# Patient Record
Sex: Male | Born: 1940
Health system: Southern US, Community
[De-identification: ages and names within clinical notes are randomized; demographics above are authoritative.]

## PROBLEM LIST (undated history)

## (undated) DIAGNOSIS — I219 Acute myocardial infarction, unspecified: Secondary | ICD-10-CM

## (undated) DIAGNOSIS — E785 Hyperlipidemia, unspecified: Secondary | ICD-10-CM

## (undated) DIAGNOSIS — G250 Essential tremor: Secondary | ICD-10-CM

## (undated) DIAGNOSIS — F419 Anxiety disorder, unspecified: Secondary | ICD-10-CM

## (undated) DIAGNOSIS — J309 Allergic rhinitis, unspecified: Secondary | ICD-10-CM

## (undated) DIAGNOSIS — K219 Gastro-esophageal reflux disease without esophagitis: Secondary | ICD-10-CM

## (undated) DIAGNOSIS — I2510 Atherosclerotic heart disease of native coronary artery without angina pectoris: Secondary | ICD-10-CM

## (undated) DIAGNOSIS — M2550 Pain in unspecified joint: Secondary | ICD-10-CM

## (undated) DIAGNOSIS — M19049 Primary osteoarthritis, unspecified hand: Secondary | ICD-10-CM

## (undated) DIAGNOSIS — E119 Type 2 diabetes mellitus without complications: Secondary | ICD-10-CM

## (undated) DIAGNOSIS — L570 Actinic keratosis: Secondary | ICD-10-CM

## (undated) HISTORY — DX: Gastro-esophageal reflux disease without esophagitis: K21.9

## (undated) HISTORY — DX: Hyperlipidemia, unspecified: E78.5

## (undated) HISTORY — DX: Primary osteoarthritis, unspecified hand: M19.049

## (undated) HISTORY — DX: Atherosclerotic heart disease of native coronary artery without angina pectoris: I25.10

## (undated) HISTORY — DX: Actinic keratosis: L57.0

## (undated) HISTORY — DX: Allergic rhinitis, unspecified: J30.9

## (undated) HISTORY — DX: Acute myocardial infarction, unspecified: I21.9

## (undated) HISTORY — DX: Pain in unspecified joint: M25.50

## (undated) HISTORY — DX: Essential tremor: G25.0

## (undated) HISTORY — DX: Type 2 diabetes mellitus without complications: E11.9

## (undated) HISTORY — DX: Anxiety disorder, unspecified: F41.9

---

## 1998-07-07 ENCOUNTER — Ambulatory Visit (HOSPITAL_COMMUNITY): Admission: RE | Admit: 1998-07-07 | Discharge: 1998-07-07 | Payer: Self-pay | Admitting: Unknown Physician Specialty

## 2005-06-20 ENCOUNTER — Inpatient Hospital Stay (HOSPITAL_COMMUNITY): Admission: EM | Admit: 2005-06-20 | Discharge: 2005-06-22 | Payer: Self-pay | Admitting: Emergency Medicine

## 2005-06-21 HISTORY — PX: CORONARY ANGIOPLASTY: SHX604

## 2005-10-29 ENCOUNTER — Inpatient Hospital Stay (HOSPITAL_COMMUNITY): Admission: EM | Admit: 2005-10-29 | Discharge: 2005-10-30 | Payer: Self-pay | Admitting: Emergency Medicine

## 2005-11-18 ENCOUNTER — Encounter (HOSPITAL_COMMUNITY): Admission: RE | Admit: 2005-11-18 | Discharge: 2006-02-16 | Payer: Self-pay | Admitting: Interventional Cardiology

## 2006-11-02 ENCOUNTER — Ambulatory Visit: Payer: Self-pay | Admitting: *Deleted

## 2007-02-08 ENCOUNTER — Ambulatory Visit: Payer: Self-pay | Admitting: Cardiology

## 2007-06-09 ENCOUNTER — Ambulatory Visit: Payer: Self-pay | Admitting: Cardiology

## 2007-10-11 ENCOUNTER — Ambulatory Visit: Payer: Self-pay

## 2007-10-11 ENCOUNTER — Ambulatory Visit: Payer: Self-pay | Admitting: Cardiology

## 2008-02-08 ENCOUNTER — Ambulatory Visit: Payer: Self-pay | Admitting: Cardiology

## 2008-06-06 ENCOUNTER — Ambulatory Visit: Payer: Self-pay | Admitting: Cardiology

## 2008-10-11 DIAGNOSIS — K219 Gastro-esophageal reflux disease without esophagitis: Secondary | ICD-10-CM

## 2008-10-11 DIAGNOSIS — I2510 Atherosclerotic heart disease of native coronary artery without angina pectoris: Secondary | ICD-10-CM

## 2008-10-11 DIAGNOSIS — R079 Chest pain, unspecified: Secondary | ICD-10-CM | POA: Insufficient documentation

## 2008-10-15 ENCOUNTER — Encounter: Payer: Self-pay | Admitting: Physician Assistant

## 2008-10-16 ENCOUNTER — Ambulatory Visit: Payer: Self-pay | Admitting: Cardiology

## 2008-10-16 ENCOUNTER — Ambulatory Visit: Payer: Self-pay | Admitting: Internal Medicine

## 2008-12-10 ENCOUNTER — Ambulatory Visit: Payer: Self-pay | Admitting: Cardiology

## 2009-02-18 ENCOUNTER — Ambulatory Visit: Payer: Self-pay | Admitting: Cardiology

## 2009-05-23 ENCOUNTER — Encounter (INDEPENDENT_AMBULATORY_CARE_PROVIDER_SITE_OTHER): Payer: Self-pay | Admitting: *Deleted

## 2009-06-26 ENCOUNTER — Ambulatory Visit: Payer: Self-pay | Admitting: Cardiology

## 2009-10-31 ENCOUNTER — Ambulatory Visit: Payer: Self-pay | Admitting: Cardiology

## 2010-02-20 ENCOUNTER — Ambulatory Visit: Payer: Self-pay | Admitting: Cardiology

## 2010-06-03 ENCOUNTER — Emergency Department (HOSPITAL_COMMUNITY)
Admission: EM | Admit: 2010-06-03 | Discharge: 2010-06-03 | Payer: Self-pay | Source: Home / Self Care | Admitting: Family Medicine

## 2010-06-26 ENCOUNTER — Ambulatory Visit
Admission: RE | Admit: 2010-06-26 | Discharge: 2010-06-26 | Payer: Self-pay | Source: Home / Self Care | Attending: Cardiology | Admitting: Cardiology

## 2010-08-31 LAB — GC/CHLAMYDIA PROBE AMP, GENITAL: Chlamydia, DNA Probe: NEGATIVE

## 2010-10-22 ENCOUNTER — Encounter (INDEPENDENT_AMBULATORY_CARE_PROVIDER_SITE_OTHER): Payer: Self-pay

## 2010-10-22 DIAGNOSIS — R0989 Other specified symptoms and signs involving the circulatory and respiratory systems: Secondary | ICD-10-CM

## 2010-11-06 NOTE — H&P (Signed)
NAMECASS, VANDERMEULEN NO.:  [id number]   MEDICAL RECORD NO.:  [id number]          PATIENT TYPE:  INP   LOCATION:  3714                         FACILITY:  [facility]   PHYSICIAN:  Jon Quarry, MD      DATE OF BIRTH:  October 13, 1940   DATE OF ADMISSION:  06/20/2005  DATE OF DISCHARGE:                                HISTORY & PHYSICAL   [facility] is a 70 year old man who is generally cared for by Dr. Duane [facility]. [facility] indicates that he takes Inderal LA 60 milligrams daily.  Recently, this dosage has been increased 80 milligrams daily. He says he  takes this medication for two reasons; one is because of a tremor of his  right hand and the other is because of mild hypertension. [facility] recalls  that several weeks ago he had an episode of dizziness followed by substernal  chest aching. He tried to relax and take his mind off the problem and it  eventually resolved spontaneously. Then today he was walking in [facility] with  his wife when he began to experience substernal chest aching. The pain  radiated up to his neck and into his jaw. His wife said he looked very pale  and was somewhat diaphoretic. She transported him to the [facility] where he was given one sublingual nitroglycerin. He said the put it  under his tongue and it started to dissolve and the doctor told him to take  it out for some reason. Therefore, he was not sure whether the nitroglycerin  had any impact on the pain or not. He also received two aspirin. EMS was  summoned and he was transported to the [facility]. In the  [facility], an EKG was obtained which showed normal sinus rhythm with no  ischemic changes. His blood pressure is 123/83, pulse was 84, O2 saturation  was 99%.   Laboratory studies included a hemoglobin of 15.3, glucose of 121, potassium  4.2. Initial cardiac enzymes were negative. We were asked to see the patient  to arrange admission. [facility]  indicates that recently he has been having  problems with acid reflux which has awakened him at night on two occasions.  He also will experience heartburn after eating. He cannot recall any  episodes of exertional chest pain. He says he is not having any dyspnea on  exertion. His family history is significant in that his father died at the  age of 53 of myocardial infarction. His father also had CABG x3 at age 61.  The patient was told on one occasion his cholesterol level was elevated.  More recently when his cholesterol level was checked it was found to be  normal.   PAST MEDICAL HISTORY:  He medications consist of Ativan 0.5 milligrams 1-2  t.i.d., Inderal LA 80 milligrams daily. He is allergic to CODEINE. He says  that he had a cyst excised from his ear. Otherwise he has had no operations.  Medical illnesses are otherwise none except as described above. Family  history is as described above. His mother has a  history of colon cancer. He  has one brother who is in good health.   SOCIAL HISTORY:  He drinks about one beer per month. He does not smoke. He  does not use drugs. He lives with his wife and as previously mentioned is  usually quite active and is in good health.   REVIEW OF SYSTEMS:  HEAD:  He will sometimes have a dull frontal headache.  He does not have a headache currently. EYES:  Denies diplopia or visual  blurring. EAR, NOSE AND THROAT:  Denies earache, sinus pain or sore throat.  CHEST:  Denies coughing wheezing or chest congestion. CARDIOVASCULAR:  See  above. GI:  See above. There has been no hematemesis or melena. GU:  Denies  dysuria or urinary frequency. NEURO:  There is no history of seizure or  stroke. ENDOCRINE:  Denies excessive thirst, urinary frequency or nocturia.   PHYSICAL EXAM:  [facility] is a pleasant cooperative man who is currently  not experiencing any chest pain. Vital signs are as described above. HEENT  exam is within normal limits. Carotid 2+  without bruits. Chest is clear.  Examination of the back reveals no CVA or point tenderness. Cardiovascular  reveals normal S1-S2. There are no rubs, murmurs or gallops. The abdomen is  benign. There are normal bowel sounds without masses or tenderness.  Neurologic testing and examination of the extremities is normal.   IMPRESSION:  1.  Recurrent chest pain, atypical cardiac versus gastrointestinal cause.  2.  Chronic tremor.  3.  History of hypertension.  4.  Chronic anxiety.  5.  Recent history suggestive of gastroesophageal reflux.   PLAN:  We will admit the patient for telemetry. Continue aspirin, Lovenox  and beta blocker. We will obtain a cardiology consult for stress testing and  treat the patient empirically with Protonix because of his history of acid  reflux.           ______________________________  Jon Quarry, MD     SY/MEDQ  D:  06/20/2005  T:  06/20/2005  Job:  [id number]   cc:   C. [facility], M.D.  Fax: [phone]

## 2010-11-06 NOTE — Consult Note (Signed)
NAMEEMERIL, STILLE NO.:  [id number]   MEDICAL RECORD NO.:  [id number]          PATIENT TYPE:  INP   LOCATION:  6533                         FACILITY:  [facility]   PHYSICIAN:  Lyn Records, M.D.   DATE OF BIRTH:  03/28/1941   DATE OF CONSULTATION:  10/29/2005  DATE OF DISCHARGE:                                   CONSULTATION   REFERRING PHYSICIAN:  Dr. Corky Crafts.   CONCLUSIONS:  1.  Acute coronary syndrome.  2.  Gastroesophageal reflux.  3.  Emotional stress/anxiety disorder.   RECOMMENDATIONS:  1.  IV Integrilin.  2.  IV nitroglycerin.  3.  Lovenox, already received.  4.  Coronary angiography.  5.  Beta blocker therapy.   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  He was admitted to the hospital in early January of  2007 and had a trace-positive troponin.  Cardiolite study did not  demonstrate evidence of ischemia.  He subsequently underwent a GI evaluation  in [place] and was found to have probable gastritis and was placed on  80 mg of Protonix daily with relief of symptoms.  Only within the past week  or 2 since Protonix has been decreased to 40 mg per day has he had a  recurrence of this burning discomfort.  The discomfort seems to be  precipitated by emotional stress.  It is not exertion-related.  The episodes  occur spontaneously.  Last evening after a large steak meal, he developed  substernal burning, became diaphoretic and according to his wife, pale-  appearing.  The discomfort was then associated with some nausea and  vomiting.  Because of persisting severe pain, he was brought to the  emergency room.  The initial point-of-care markers were normal; the  subsequent markers have been elevated (please see below).  He continues  to  currently have some mild sensation of fullness or burning in his chest,  although this is much improved compared to prior.   SIGNIFICANT MEDICAL PROBLEMS:  1.  Gastroesophageal reflux.  2.  Anxiety disorder.  3.  History of hypertension.  4.  History of a tremor.   ALLERGIES:  CODEINE.   TYPICAL MEDICATIONS:  1.  Protonix 40 mg twice a day.  2.  Lorazepam 0.5 mg one to two tablets 3-4 times per day.  3.  Clidinium one to two capsules 4 times a day as needed for GI pain.  4.  Clarinex 5 mg p.o. daily.  5.  Has previously been on Inderal LA 80 mg per day, but it is unclear if he      continues to take this.   HABITS:  Does not smoke.  May have a beer once per month.   FAMILY HISTORY:  His father died of a myocardial infarction at age 26.  His  father had a history of coronary artery bypass surgery at age 85.  His  mother has colon cancer.  He has a brother who has no significant vascular  history.   PHYSICAL EXAMINATION:  GENERAL:  The patient appears uncomfortable and  states he has not been able to sleep all night in the emergency room.  VITAL SIGNS:  His blood pressure is 118/70.  Heart rate is 88.  NECK:  Neck veins are not distended.  Carotid upstroke is 2+.  LUNGS:  Clear.  CARDIAC:  No rub, click, no gallop, no murmur.  ABDOMEN:  Soft.  Bowel sounds normal.  EXTREMITIES:  No edema.  NEUROLOGIC:  Exam unremarkable.   LABORATORY AND ACCESSORY CLINICAL DATA:  EKG is abnormal, demonstrating  inferior T wave abnormality and also mild ST elevation in V2; this is new  compared to tracings from January.   I have not seen chest x-ray yet.   The patient's troponin I done by regular laboratory testing is 2.21.  CK/MB  are 253/20.  BUN and creatinine are normal at 16 and 1.1.   DISCUSSION:  The patient has an acute coronary syndrome.  His story has been  difficult to decipher because he also has esophageal reflux.  He needs  coronary angiography to define coronary anatomy  and potentially treat high-  grade obstruction in his circumflex or right coronary.      Lyn Records, M.D.  Electronically Signed     HWS/MEDQ  D:  10/29/2005  T:  10/30/2005  Job:  [id number]   cc:   C. Duane Lope, M.D.  Fax: [phone]   Corky Crafts, MD  Fax: [phone]

## 2010-11-06 NOTE — H&P (Signed)
[name], Sanchez NO.:  [id number]   MEDICAL RECORD NO.:  [id number]          PATIENT TYPE:  INP   LOCATION:  1826                         FACILITY:  [facility]   PHYSICIAN:  Jackie Plum, M.D.DATE OF BIRTH:  10-04-1940   DATE OF ADMISSION:  10/28/2005  DATE OF DISCHARGE:                                HISTORY & PHYSICAL   CHIEF COMPLAINT:  Chest pain for three days.   HISTORY OF PRESENT ILLNESS:  The patient is a very pleasant 70 year old  gentleman with the only known CAD risk factors of male sex and age, more  than 69 years of age, who presents for the second time since January of this  year with chest pain. Chest pain is described as being midsternal pain. Has  been going on and off intermittently, lasting up to two hours, usually after  meals. Pain is described more like a heaviness with a dyspeptic feature as  previously. Does not have any associated PND, orthopnea, diaphoresis, nausea  or vomiting, palpitations. No history of melena, hematochezia, cough, sputum  production, shortness of breath, fever or chills. In the emergency room, the  patient was seen by Dr. Linwood Dibbles of ED medicine, though he thought that  patient's symptoms were probably GI related.   The patient had had similar chest pains in January of 2007, whereupon he was  seen by [facility] in consultation, and Cardiolite stress test was  done which came back to be negative. At that time, he was referred to see  his primary gastroenterologist in [facility] who felt that his pain was  GI related and started on him on two times daily dose of Protonix and  clidinium hydrochlorothiazide. His symptoms since then according to the  patient have been fairly well controlled, except for recently when his dose  of Protonix was decreased to once daily. In the emergency room, the patient  was seen and evaluated by Dr. Linwood Dibbles of emergency medicine. He felt that  patient's chest pain was very  atypical and actually consistent with  dyspepsia. However, he was concerned about a 12-lead EKG which he obtained  and compared to patient's EKG in January of 2007, when he came to the  hospital with chest pain. The comparison indicated new T-wave inversion in  lead III and aVF and was therefore concerned about possible inferior  ischemia. He therefore called hospitalist service for further evaluation and  management.   PAST MEDICAL HISTORY:  The patient denies any previous history of diabetes,  hypertension, dyslipidemia, heart disease, strokes. He has history of  gastroesophageal reflux disease/dyspepsia for which he takes his Protonix  and clidinium per GI medicine doctor in [place].   FAMILY HISTORY:  His father had a heart attack in his early 81s and had a  CABG in his late 25s.   SOCIAL HISTORY:  The patient is married. He does not smoke cigarettes.  Denies any illicit drug use or alcohol abuse.   REVIEW OF SYSTEMS:  As noted above.   PHYSICAL EXAMINATION:  VITAL SIGNS:  BP 103/67, pulse 82, respirations 18,  temperature 97.5 degrees  Fahrenheit. O2 saturation 98%.  GENERAL:  The patient was comfortable. His pain was nearly completely  resolved at time of my evaluation and indeed actually had wanted to go home.  NECK:  Supple. No JVD.  HEENT:  Oropharynx moist. No pallor. No icterus.  LUNGS:  Clear to auscultation.  CARDIAC:  Regular. No gallops.  ABDOMEN:  Soft. No epigastric tenderness appreciated.  EXTREMITIES:  No cyanosis. No edema.  CENTRAL NERVOUS SYSTEM:  Nonfocal.   LABORATORY DATA:  EKG as noted above. X-ray of the chest did not reveal any  acute infiltrate. WBC count 13.0, hemoglobin 15.8, hematocrit 45.1, platelet  count 202. Point of care myoglobin 117. CK-MB 1.5, troponin less than 0.5.  Sodium 141, potassium 4.2, chloride 106, CO2 31, glucose 120, BUN 15,  creatinine 1.1, calcium 9.2, total protein 6.2, albumin 4.2, AST 22, ALT 26,  alkaline phosphatase  83, bilirubin 1.2, lipase 22. Repeat enzymes  subsequently indicated myoglobin of 123, CK-MB of 2.1, troponin of 0.08.   IMPRESSION:  Chest pain, atypical. Sounds gastrointestinal in  origin/dyspeptic. However, agree with the ED physician regarding new EKG  change compared to previously. The question is whether patient would need to  be admitted for cardiac catheterization should he rule out versus discharge  home for aggressive GI management. Therefore, plan to contact [facility] for  further evaluation and complete ruling him out. Should patient rule out,  then we would have to address issues mentioned above per cardiology.      Jackie Plum, M.D.  Electronically Signed     GO/MEDQ  D:  10/29/2005  T:  10/29/2005  Job:  [id number]

## 2010-11-06 NOTE — Consult Note (Signed)
NAMEHILARY, [facility]              ACCOUNT NO.:  [id number]   MEDICAL RECORD NO.:  [id number]          PATIENT TYPE:  INP   LOCATION:  3714                         FACILITY:  [facility]   PHYSICIAN:  Corky Crafts, MDDATE OF BIRTH:  1941/04/08   DATE OF CONSULTATION:  DATE OF DISCHARGE:                                   CONSULTATION   REASON FOR CONSULTATION:  Chest pain.   HISTORY OF PRESENT ILLNESS:  The patient is a 70 year old man with a history  of hypertension who has been experiencing chest pressure over the past 2  months.  He states that it feels like he needs to belch.  The sensation is  relieved when he does belch.  The most common factor that elicits this pain  is anxiety when he is able to relax, the pain goes away.  He is often able  to get rid of the pain by getting up and walking around as this takes his  mind off of what is making him anxious.  He was recently diagnosed with  attention deficit hyperactivity disorder and is being treated for that with  medication.  He denies shortness of breath with the chest pain.  He has not  had diaphoresis.  Occasionally, the pain does radiate to his neck.  Since he  has been in the hospital, he has not had any chest pain.   PAST MEDICAL HISTORY:  1.  Tremor.  2.  Hypertension.  3.  Urethral stricture.   PAST SURGICAL HISTORY:  1.  Urethral surgery.  2.  Ear cyst removal.   MEDICATIONS:  1.  Inderal LA 60 mg daily.  2.  Focalin for ADHD.  3.  Ativan t.i.d. for an anxiety disorder.   ALLERGIES:  CODEINE.   SOCIAL HISTORY:  The patient does not smoke.  He occasionally drinks  alcohol.  No IV drug abuse.  No harmful medicines.  He works as a Metallurgist.   FAMILY HISTORY:  No early coronary artery disease.  His father died with an  MI at age 40.   REVIEW OF SYSTEMS:  No fevers, chills, no weight loss, no shortness of  breath.  Chest pain as described above.  No nausea, vomiting, no dysuria.  No focal weakness.   All other systems negative.   PHYSICAL EXAMINATION:  VITAL SIGNS:  Blood pressure 105/72, heart rate is 65  ranging up to 80 beats per minute.  Respiratory rate is 18.  GENERAL APPEARANCE:  In general, the patient is awake and alert and in no  apparent distress.  HEENT:  Head normocephalic, atraumatic.  Eyes:  Extraocular muscles are  intact.  Oropharynx is clear.  NECK:  Supple.  No JVD, no carotid bruits.  CARDIOVASCULAR:  Regular rate and rhythm, S1, S2.  The S2 is physiologically  split.  LUNGS:  Clear to auscultation bilaterally.  ABDOMEN:  Soft, nontender, nondistended.  EXTREMITIES:  Showed no clubbing, cyanosis or edema, 2+ posterior tibial  pulses bilaterally.  NEURO:  No focal deficit.   ELECTROCARDIOGRAM:  Normal sinus rhythm, no ST-T wave changes.   LABORATORY  DATA:  Hematocrit 42, potassium 3.7, creatinine 0.9, blood sugar  is 126.  Cardiac enzymes:  Initial CK 10 being troponin, 42, 1.0 and less  than 0.5 seconds.  CK of 44, MB of 1.4 and troponin of 0.05.  Final set:  51  was the CK, 1.6 MB, 0.07 was troponin.  Cholesterol shows LDL of 83.   ASSESSMENT AND PLAN:  This is a 70 year old with chest pain that has many  atypical features and a borderline troponin level of:  1.  Cardiac.  We will plan exercise Cardiolite in the a.m. to evaluate for      ischemia.  His symptoms are atypical enough, but I do not feel that      catheterization is warranted.  The patient agrees the significance of      the troponin of 0.07 is unclear.  2.  Will hold Inderal for a stress test.  Otherwise, the patient would      benefit from an aspirin 81 mg p.o. daily along with other preventive      care.  He may need fish oil for elevated triglycerides.  3.  I will follow the patient while he is in the hospital.      Corky Crafts, MD  Electronically Signed     JSV/MEDQ  D:  06/21/2005  T:  06/21/2005  Job:  [id number]

## 2010-11-06 NOTE — Cardiovascular Report (Signed)
NAMESERGIO, ZAWISLAK              ACCOUNT NO.:  [id number]   MEDICAL RECORD NO.:  [id number]          PATIENT TYPE:  INP   LOCATION:  6533                         FACILITY:  [facility]   PHYSICIAN:  Corky Crafts, MDDATE OF BIRTH:  [birth year]   DATE OF PROCEDURE:  10/29/2005  DATE OF DISCHARGE:  10/30/2005                              CARDIAC CATHETERIZATION   REFERRING PHYSICIAN:  Jackie Plum, M.D.   PROCEDURES:  1.  Left heart catheterization.  2.  Left ventriculogram.  3.  Coronary angiogram.  4.  Percutaneous coronary intervention of left anterior descending  and      intracoronary vascular ultrasound of left anterior descending.   OPERATOR:  Corky Crafts, M.D.   INDICATIONS:  Non-ST-elevation Myocardial Infarction with recurrent chest  pain.   PROCEDURAL NARRATIVE:  The risks and benefits of cardiac catheterization  were explained to the patient, and informed consent was obtained.  The  patient was brought to the catheterization lab and placed on the table.  He  was prepped and draped in the usual sterile fashion.  His right groin was  infiltrated with 1% lidocaine.  A 6-French arterial sheath was placed into  the right femoral artery using the modified Seldinger technique.  Left  coronary artery angiography was performed using a JL4.0 catheter.  The  catheter was advanced to the ascending aorta and into the vessel ostium  under fluoroscopic guidance.  Digital angiography was performed in multiple  projections using hand injection of contrast.  The right coronary artery  angiography was then  performed using a JR4.0 catheter.  Digital angiography  was performed in multiple projections using hand injection of contrast.  The  percutaneous coronary intervention was then performed.  Please see below for  details.  A pigtail catheter was then advanced to the ascending aorta across  the aortic valve under fluoroscopic guidance.  Continuous pressure  monitoring  was performed.  A 30-degree RAO ventriculogram was performed  using power injection of contrast.  Then, under continuous pressure  monitoring, the catheter was pulled back across the aortic valve into the  ascending aorta.  The 6-French arterial sheath will be pulled using manual  compression.   FINDINGS:  The left main was widely patent.   The circumflex was a large vessel which was widely patent.  There were minor  irregularities.  There was a large first obtuse marginal also with minor  irregularities.   The left anterior descending was a large vessel.  There was a 90% proximal  LAD lesion. In the mid LAD, there was a 50 to 70% lesion.  There were other  minor irregularities throughout the vessel.  There was a small first  diagonal with no apparent disease.  The second and third diagonal were also  small vessels.   The right coronary artery was a large dominant vessel.  There were minor  luminal irregularities in this vessel.   The left ventriculogram showed a small area of anterolateral hypokinesis.  The overall ejection fraction was 55%.   HEMODYNAMICS:  Left ventricular pressure 109/11 with an LV EDP of 19 mmHg.  Aortic pressure 104/64 with a mean aortic pressure of 81 mmHg.   PERCUTANEOUS CORONARY INTERVENTION NARRATIVE:  A COS 3.5 guiding catheter  was used to engage the ostium of the left main coronary artery under  fluoroscopic guidance.  An Clinical cytogeneticist was advanced across the lesion in  the LAD.  A 2.5 x 12 Maverick balloon was advanced to the lesion in the  proximal LAD and inflated to 8 atmospheres for 22 seconds.  Then a 3.0 x 18  mm Cypher stent was advanced to the lesion in the proximal LAD and  positioned under fluoroscopic guidance.  It was deployed at 15 atmospheres  for 43 seconds.  The stent was then post dilated with a 3.5 x 12 Quantum  Maverick balloon, inflated to 10 atmospheres for 18 seconds.  An IVUS was  then performed which showed under deployment  of the LAD stent.  At the  distal edge of the stent, the 60 to 70% mid LAD lesion had a cross sectional  area of 3.7 mm squared.  A 3.0 x 13 mm Cypher stent was then advanced to the  distal edge of the recently placed stent and deployed at 16 atmospheres for  34 seconds.  The 3.5 x 12 Quantum Maverick balloon was then reinserted and  used to post dilated the entire stented area.  The distal area of the stent  was dilated to 16 atmospheres for 32 seconds and then 18 atmospheres for 25  seconds.  The balloon was withdrawn again and inflated to 18 atmospheres for  29 seconds . The proximal edge of the stent was then inflated to 14  atmospheres for 17 seconds.  We then repeated the IVUS which showed good  apposition of all of the stented segments.  Angiographically, there was TIMI-  3 flow with a 0% residual stenosis.   IMPRESSION:  1.  Significant left anterior descending disease with TIMI-2 flow.  This was      the cause of the patient's symptoms and abnormal cardiac enzymes.  2.  Successful overlapping drug-eluting stent placement to the left anterior      descending with a 3.0 x 18 mm Cypher stent and 3.0 x 13 mm Cypher stent.      The stented area was then post dilated to 3.6 mm in diameter proximally.  3.  Excellent angiographic appearance as well as IVUS appearance.   RECOMMENDATIONS:  1.  The patient will continue Integrilin for 18 hours intravenously.  2.  He will be monitored on telemetry  3.  We will continue aspirin 325 mg p.o. daily, Plavix 75 mg p.o. daily      indefinitely.  4.  He will continue atenolol.  5.  We will also initiate a statin.  6.  If his blood pressure will tolerate, we will consider adding an ACE      inhibitor.  7.  I will follow up with the patient in the office.  8.  We will consider discharging the patient tomorrow if there are no      complications.  9.  The sheath will be removed using manual compression.      Corky Crafts, MD   Electronically Signed     JSV/MEDQ  D:  10/29/2005  T:  10/30/2005  Job:  [id number]

## 2010-12-29 ENCOUNTER — Encounter: Payer: Self-pay | Admitting: Physician Assistant

## 2011-09-02 DIAGNOSIS — N400 Enlarged prostate without lower urinary tract symptoms: Secondary | ICD-10-CM | POA: Diagnosis not present

## 2011-09-28 DIAGNOSIS — K146 Glossodynia: Secondary | ICD-10-CM | POA: Diagnosis not present

## 2011-09-28 DIAGNOSIS — B009 Herpesviral infection, unspecified: Secondary | ICD-10-CM | POA: Diagnosis not present

## 2011-10-04 DIAGNOSIS — Z0000 Encounter for general adult medical examination without abnormal findings: Secondary | ICD-10-CM | POA: Diagnosis not present

## 2011-10-04 DIAGNOSIS — E119 Type 2 diabetes mellitus without complications: Secondary | ICD-10-CM | POA: Diagnosis not present

## 2011-10-04 DIAGNOSIS — I2510 Atherosclerotic heart disease of native coronary artery without angina pectoris: Secondary | ICD-10-CM | POA: Diagnosis not present

## 2011-11-04 ENCOUNTER — Encounter (INDEPENDENT_AMBULATORY_CARE_PROVIDER_SITE_OTHER): Payer: Self-pay

## 2011-11-04 DIAGNOSIS — R0989 Other specified symptoms and signs involving the circulatory and respiratory systems: Secondary | ICD-10-CM

## 2011-11-22 DIAGNOSIS — E1139 Type 2 diabetes mellitus with other diabetic ophthalmic complication: Secondary | ICD-10-CM | POA: Diagnosis not present

## 2011-12-06 DIAGNOSIS — L570 Actinic keratosis: Secondary | ICD-10-CM | POA: Diagnosis not present

## 2012-03-01 DIAGNOSIS — N35919 Unspecified urethral stricture, male, unspecified site: Secondary | ICD-10-CM | POA: Diagnosis not present

## 2012-04-04 DIAGNOSIS — F411 Generalized anxiety disorder: Secondary | ICD-10-CM | POA: Diagnosis not present

## 2012-04-04 DIAGNOSIS — E7800 Pure hypercholesterolemia, unspecified: Secondary | ICD-10-CM | POA: Diagnosis not present

## 2012-04-04 DIAGNOSIS — J309 Allergic rhinitis, unspecified: Secondary | ICD-10-CM | POA: Diagnosis not present

## 2012-04-04 DIAGNOSIS — Z23 Encounter for immunization: Secondary | ICD-10-CM | POA: Diagnosis not present

## 2012-04-04 DIAGNOSIS — Z8619 Personal history of other infectious and parasitic diseases: Secondary | ICD-10-CM | POA: Diagnosis not present

## 2012-04-04 DIAGNOSIS — L570 Actinic keratosis: Secondary | ICD-10-CM | POA: Diagnosis not present

## 2012-04-04 DIAGNOSIS — B009 Herpesviral infection, unspecified: Secondary | ICD-10-CM | POA: Diagnosis not present

## 2012-04-04 DIAGNOSIS — K146 Glossodynia: Secondary | ICD-10-CM | POA: Diagnosis not present

## 2012-04-04 DIAGNOSIS — G250 Essential tremor: Secondary | ICD-10-CM | POA: Diagnosis not present

## 2012-04-04 DIAGNOSIS — E119 Type 2 diabetes mellitus without complications: Secondary | ICD-10-CM | POA: Diagnosis not present

## 2012-04-04 DIAGNOSIS — R21 Rash and other nonspecific skin eruption: Secondary | ICD-10-CM | POA: Diagnosis not present

## 2012-04-04 DIAGNOSIS — L280 Lichen simplex chronicus: Secondary | ICD-10-CM | POA: Diagnosis not present

## 2012-04-04 DIAGNOSIS — G252 Other specified forms of tremor: Secondary | ICD-10-CM | POA: Diagnosis not present

## 2012-04-11 DIAGNOSIS — I2510 Atherosclerotic heart disease of native coronary artery without angina pectoris: Secondary | ICD-10-CM | POA: Diagnosis not present

## 2012-04-11 DIAGNOSIS — I252 Old myocardial infarction: Secondary | ICD-10-CM | POA: Diagnosis not present

## 2012-04-11 DIAGNOSIS — I10 Essential (primary) hypertension: Secondary | ICD-10-CM | POA: Diagnosis not present

## 2012-04-11 DIAGNOSIS — E782 Mixed hyperlipidemia: Secondary | ICD-10-CM | POA: Diagnosis not present

## 2012-05-15 DIAGNOSIS — L578 Other skin changes due to chronic exposure to nonionizing radiation: Secondary | ICD-10-CM | POA: Diagnosis not present

## 2012-06-26 DIAGNOSIS — D485 Neoplasm of uncertain behavior of skin: Secondary | ICD-10-CM | POA: Diagnosis not present

## 2012-06-26 DIAGNOSIS — L578 Other skin changes due to chronic exposure to nonionizing radiation: Secondary | ICD-10-CM | POA: Diagnosis not present

## 2012-07-13 DIAGNOSIS — J329 Chronic sinusitis, unspecified: Secondary | ICD-10-CM | POA: Diagnosis not present

## 2012-08-30 DIAGNOSIS — R6882 Decreased libido: Secondary | ICD-10-CM | POA: Diagnosis not present

## 2012-08-30 DIAGNOSIS — N135 Crossing vessel and stricture of ureter without hydronephrosis: Secondary | ICD-10-CM | POA: Diagnosis not present

## 2012-10-10 DIAGNOSIS — R209 Unspecified disturbances of skin sensation: Secondary | ICD-10-CM | POA: Diagnosis not present

## 2012-10-10 DIAGNOSIS — E119 Type 2 diabetes mellitus without complications: Secondary | ICD-10-CM | POA: Diagnosis not present

## 2012-10-10 DIAGNOSIS — Z125 Encounter for screening for malignant neoplasm of prostate: Secondary | ICD-10-CM | POA: Diagnosis not present

## 2012-10-10 DIAGNOSIS — I10 Essential (primary) hypertension: Secondary | ICD-10-CM | POA: Diagnosis not present

## 2012-10-10 DIAGNOSIS — E782 Mixed hyperlipidemia: Secondary | ICD-10-CM | POA: Diagnosis not present

## 2012-10-10 DIAGNOSIS — Z0000 Encounter for general adult medical examination without abnormal findings: Secondary | ICD-10-CM | POA: Diagnosis not present

## 2012-10-10 DIAGNOSIS — F411 Generalized anxiety disorder: Secondary | ICD-10-CM | POA: Diagnosis not present

## 2012-10-21 DIAGNOSIS — J019 Acute sinusitis, unspecified: Secondary | ICD-10-CM | POA: Diagnosis not present

## 2012-10-24 DIAGNOSIS — R209 Unspecified disturbances of skin sensation: Secondary | ICD-10-CM | POA: Diagnosis not present

## 2012-10-24 DIAGNOSIS — F411 Generalized anxiety disorder: Secondary | ICD-10-CM | POA: Diagnosis not present

## 2012-10-24 DIAGNOSIS — E782 Mixed hyperlipidemia: Secondary | ICD-10-CM | POA: Diagnosis not present

## 2012-10-24 DIAGNOSIS — E119 Type 2 diabetes mellitus without complications: Secondary | ICD-10-CM | POA: Diagnosis not present

## 2012-10-24 DIAGNOSIS — Z0000 Encounter for general adult medical examination without abnormal findings: Secondary | ICD-10-CM | POA: Diagnosis not present

## 2012-10-24 DIAGNOSIS — I10 Essential (primary) hypertension: Secondary | ICD-10-CM | POA: Diagnosis not present

## 2012-10-24 DIAGNOSIS — Z125 Encounter for screening for malignant neoplasm of prostate: Secondary | ICD-10-CM | POA: Diagnosis not present

## 2012-10-31 ENCOUNTER — Encounter (INDEPENDENT_AMBULATORY_CARE_PROVIDER_SITE_OTHER): Payer: Self-pay

## 2012-10-31 DIAGNOSIS — R0989 Other specified symptoms and signs involving the circulatory and respiratory systems: Secondary | ICD-10-CM

## 2012-11-17 DIAGNOSIS — E1139 Type 2 diabetes mellitus with other diabetic ophthalmic complication: Secondary | ICD-10-CM | POA: Diagnosis not present

## 2012-12-05 DIAGNOSIS — Z79899 Other long term (current) drug therapy: Secondary | ICD-10-CM | POA: Diagnosis not present

## 2012-12-05 DIAGNOSIS — E782 Mixed hyperlipidemia: Secondary | ICD-10-CM | POA: Diagnosis not present

## 2013-01-16 DIAGNOSIS — B009 Herpesviral infection, unspecified: Secondary | ICD-10-CM | POA: Diagnosis not present

## 2013-01-16 DIAGNOSIS — L280 Lichen simplex chronicus: Secondary | ICD-10-CM | POA: Diagnosis not present

## 2013-01-16 DIAGNOSIS — Z8619 Personal history of other infectious and parasitic diseases: Secondary | ICD-10-CM | POA: Diagnosis not present

## 2013-01-16 DIAGNOSIS — L298 Other pruritus: Secondary | ICD-10-CM | POA: Diagnosis not present

## 2013-01-16 DIAGNOSIS — L570 Actinic keratosis: Secondary | ICD-10-CM | POA: Diagnosis not present

## 2013-01-16 DIAGNOSIS — K146 Glossodynia: Secondary | ICD-10-CM | POA: Diagnosis not present

## 2013-02-28 DIAGNOSIS — N35919 Unspecified urethral stricture, male, unspecified site: Secondary | ICD-10-CM | POA: Diagnosis not present

## 2013-03-22 DIAGNOSIS — Z23 Encounter for immunization: Secondary | ICD-10-CM | POA: Diagnosis not present

## 2013-03-22 DIAGNOSIS — D239 Other benign neoplasm of skin, unspecified: Secondary | ICD-10-CM | POA: Diagnosis not present

## 2013-03-22 DIAGNOSIS — T148XXA Other injury of unspecified body region, initial encounter: Secondary | ICD-10-CM | POA: Diagnosis not present

## 2013-04-11 DIAGNOSIS — E119 Type 2 diabetes mellitus without complications: Secondary | ICD-10-CM | POA: Diagnosis not present

## 2013-04-11 DIAGNOSIS — L570 Actinic keratosis: Secondary | ICD-10-CM | POA: Diagnosis not present

## 2013-04-12 ENCOUNTER — Encounter: Payer: Self-pay | Admitting: *Deleted

## 2013-04-12 ENCOUNTER — Encounter: Payer: Self-pay | Admitting: Interventional Cardiology

## 2013-04-13 ENCOUNTER — Encounter: Payer: Self-pay | Admitting: Interventional Cardiology

## 2013-04-13 ENCOUNTER — Ambulatory Visit (INDEPENDENT_AMBULATORY_CARE_PROVIDER_SITE_OTHER): Payer: Medicare Other | Admitting: Interventional Cardiology

## 2013-04-13 VITALS — BP 140/70 | HR 64 | Ht 67.0 in | Wt 163.0 lb

## 2013-04-13 DIAGNOSIS — E782 Mixed hyperlipidemia: Secondary | ICD-10-CM | POA: Insufficient documentation

## 2013-04-13 DIAGNOSIS — I252 Old myocardial infarction: Secondary | ICD-10-CM

## 2013-04-13 DIAGNOSIS — I2510 Atherosclerotic heart disease of native coronary artery without angina pectoris: Secondary | ICD-10-CM | POA: Diagnosis not present

## 2013-04-13 DIAGNOSIS — I10 Essential (primary) hypertension: Secondary | ICD-10-CM

## 2013-04-13 LAB — LIPID PANEL
HDL: 36.4 mg/dL — ABNORMAL LOW (ref >39.00)
Total CHOL/HDL Ratio: 2
VLDL: 21.2 mg/dL (ref 0.0–40.0)

## 2013-04-13 LAB — ALT: ALT: 21 U/L (ref 0–53)

## 2013-04-13 MED ORDER — LOSARTAN POTASSIUM 25 MG PO TABS: 25.0000 mg | ORAL_TABLET | Freq: Every day | ORAL | Status: DC

## 2013-04-13 MED ORDER — CLOPIDOGREL BISULFATE 75 MG PO TABS: 75.0000 mg | ORAL_TABLET | Freq: Every day | ORAL | Status: DC

## 2013-04-13 MED ORDER — NITROGLYCERIN 0.4 MG SL SUBL: 0.4000 mg | SUBLINGUAL_TABLET | SUBLINGUAL | Status: DC | PRN

## 2013-04-13 NOTE — Progress Notes (Signed)
Patient ID: Jon Sanchez, male   DOB: December 28, 1940, 72 y.o.   MRN: [id number]    [address] [place], [place]  [address] Phone: [phone] Fax:  [phone]  Date:  04/13/2013   ID:  Jon Sanchez, DOB [birth year], MRN [id number]  PCP:  Jon primary provider on file.      History of Present Illness: Jon Sanchez is a 71 y.o. male who has had a DES to the LAD. He has been under stress due to caring for his elderly mother. CAD/ASCVD:  He has not been exercising that much. He has resumed walking one hour per day, but has not been regular. Denies : Diaphoresis.  Dizziness.  Dyspnea on exertion.  Fatigue.  Leg edema.  Nitroglycerin.     Wt Readings from Last 3 Encounters:  04/13/13 163 lb (73.936 kg)     Past Medical History  Diagnosis Date  . CAD (coronary artery disease)   . Chest pain   . GERD (gastroesophageal reflux disease)   . Myocardial infarct   . Pain in joint   . Osteoarthrosis, unspecified whether generalized or localized, hand   . Diabetes   . Allergic rhinitis   . Hyperlipidemia   . Anxiety   . Benign essential tremor   . Actinic keratoses     Current Outpatient Prescriptions  Medication Sig Dispense Refill  . aspirin 81 MG tablet Take 81 mg by mouth daily.      . B Complex Vitamins (VITAMIN B COMPLEX PO) Take 1 tablet by mouth daily.      . clopidogrel (PLAVIX) 75 MG tablet Take 75 mg by mouth daily.      Marland Kitchen ezetimibe-simvastatin (VYTORIN) 10-40 MG per tablet Take 1 tablet by mouth at bedtime.      Marland Kitchen LORazepam (ATIVAN) 0.5 MG tablet Take 0.5 mg by mouth every 8 (eight) hours.      Marland Kitchen losartan (COZAAR) 25 MG tablet Take 25 mg by mouth daily.      . nitroGLYCERIN (NITROSTAT) 0.4 MG SL tablet Place 0.4 mg under the tongue every 5 (five) minutes as needed for chest pain.      . Omega-3 Fatty Acids (FISH OIL PO) Take 1 tablet by mouth daily.      . sertraline (ZOLOFT) 100 MG tablet Take 100 mg by mouth daily.       Jon current  facility-administered medications for this visit.    Allergies:    Allergies  Allergen Reactions  . Codeine     Social History:  The patient  reports that he has never smoked. He does not have any smokeless tobacco history on file. He reports that he does not drink alcohol or use illicit drugs.   Family History:  The patient's family history includes Colon cancer in his mother; Coronary artery disease in an other family member; Heart attack in his father; Lung cancer in his father.   ROS:  Please see the history of present illness.  Jon nausea, vomiting.  Jon fevers, chills.  Jon focal weakness.  Jon dysuria. Stress with family situation.  All other systems reviewed and negative.   PHYSICAL EXAM: VS:  BP 140/70  Pulse 64  Ht 5\' 7"  (1.702 m)  Wt 163 lb (73.936 kg)  BMI 25.52 kg/m2 Well nourished, well developed, in Jon acute distress HEENT: normal Neck: Jon JVD, Jon carotid bruits Cardiac:  normal S1, S2; RRR;  Lungs:  clear to auscultation bilaterally, Jon wheezing, rhonchi  or rales Abd: soft, nontender, Jon hepatomegaly Ext: Jon edema Skin: warm and dry Neuro:   Jon focal abnormalities noted  EKG:  Normal     ASSESSMENT AND PLAN:  Coronary atherosclerosis of native coronary artery  Continue Plavix Tablet, 75 MG, 1 tablet, Orally, Once a day, 90 day(s), 90, Refills 3 Continue Nitroglycerin Tablet Sublingual, 0.4 MG, 1 tablet under the tongue, Sublingual, as directed, 25, Refills 6 Diagnostic Imaging:EKG Jon Sanchez,Jon Sanchez 04/11/2012 08:51:24 AM > Jon Sanchez,Jon Sanchez 04/11/2012 09:36:50 AM > Jon Sanchez, Jon Sanchez  Jon angina.First generation DES in critical aea of his heart. WOuld continue plavix.  He has significant bruising, especially after a fall.  WIll stop aspirin. Normal stress test in 2008. Jon symptoms like what he had prior to his stent.    2. Mixed hyperlipidemia Was inresearch study. Cholesterol was regulated by [employer],  Finished.  Hwas started on Vytorin .  He will need  a lipid panel.    3. Old myocardial infarction  Continue Aspirin Tablet Delayed Release, 81 MG, 1 tablet, Orally, Once a day Continue Propranolol HCl CR Capsule Extended Release 24 Hour, 60 MG, TAKE 1 CAPSULE BY MOUTH EVERY DAY Jon CHF.    4. Benign hypertension  Continue Losartan Potassium Tablet, 25 MG, 1 tablet, Orally, Once a day, 90 day(s), 90, Refills 3 Cough with lisinopril. Diastolics much better on meds.  Controlled at home.    Preventive Medicine  Adult topics discussed:  Diet: healthy diet, low calorie, low fat.  Exercise: at least 30 minutes of aerobic exercise, 5 days a week.      Signed, Jon Mare, MD, [facility] 04/13/2013 9:28 AM

## 2013-04-13 NOTE — Patient Instructions (Signed)
Your physician recommends that you return for lab work today for LIPID AND ALT.  Your physician has recommended you make the following change in your medication:  1. Stop ASA 81mg .  2.Continue all other medications.  Refilled Nitro, Plavix and Losartan to [facility].  Your physician wants you to follow-up in: 1 year with Dr. Eldridge Dace. You will receive a reminder letter in the mail two months in advance. If you don't receive a letter, please call our office to schedule the follow-up appointment.

## 2013-04-19 ENCOUNTER — Telehealth: Payer: Self-pay | Admitting: Cardiology

## 2013-04-19 DIAGNOSIS — E782 Mixed hyperlipidemia: Secondary | ICD-10-CM

## 2013-04-19 MED ORDER — EZETIMIBE-SIMVASTATIN 10-40 MG PO TABS: ORAL_TABLET | ORAL | Status: DC

## 2013-04-19 NOTE — Telephone Encounter (Signed)
Message copied by Theda Sers on Thu Apr 19, 2013  1:30 PM ------      Message from: SMART, Gaspar Skeeters      Created: Mon Apr 16, 2013 11:42 AM       RF:  CAD, Diabetes, HTN, age - LDL goal < 70, non-HDL < 100      Meds:  Vytorin 10/40 mg qd.      Patient was in the IMPROVE-IT study (Simvastatin 40 mg vs Vytorin 10/40 mg) until 10/2012 when it finished.  Patient's cholesterol was apparently well controlled in study, but we were blinded to which arm he was on.  He was started on Vytorin 10/40 mg in 10/2012.  His LDL is currently down to 29 mg/dL.  Non-HDL is 51 mg/dL.  Given he also has diabetes will check an NMR LipoProfile in the future to assess if he needs such a potent agent.  For now will reduce vytorin to 1/2 tablet daily and check an NMR in 3 months.  If LDL-P well below 1000 nmol/L, could consider simvastatin 40 mg at that time instead.      ALT normal.      Plan:      1.  Reduce Vytorin 10/40 mg to 1/2 tablet daily.      2.  Check an NMR LipoProfile and Hepatic panel in 3 months.      Please notify patient, update meds, and set up labs. Thanks. ------

## 2013-05-02 ENCOUNTER — Other Ambulatory Visit: Payer: Self-pay | Admitting: Interventional Cardiology

## 2013-06-25 DIAGNOSIS — L259 Unspecified contact dermatitis, unspecified cause: Secondary | ICD-10-CM | POA: Diagnosis not present

## 2013-07-20 ENCOUNTER — Other Ambulatory Visit: Payer: Medicare Other

## 2013-07-27 ENCOUNTER — Other Ambulatory Visit (INDEPENDENT_AMBULATORY_CARE_PROVIDER_SITE_OTHER): Payer: Medicare Other

## 2013-07-27 DIAGNOSIS — E782 Mixed hyperlipidemia: Secondary | ICD-10-CM

## 2013-07-27 LAB — ALT: ALT: 28 U/L (ref 0–53)

## 2013-07-30 LAB — NMR LIPOPROFILE WITH LIPIDS
CHOLESTEROL, TOTAL: 99 mg/dL (ref <200)
HDL PARTICLE NUMBER: 31.6 umol/L (ref >=30.5)
HDL Size: 8.6 nm — ABNORMAL LOW (ref >=9.2)
HDL-C: 35 mg/dL — AB (ref >=40)
LDL CALC: 44 mg/dL (ref <100)
LDL Particle Number: 863 nmol/L (ref <1000)
LDL Size: 19.6 nm — ABNORMAL LOW (ref >20.5)
LP-IR SCORE: 64 — AB (ref <=45)
Large HDL-P: 1.5 umol/L — ABNORMAL LOW (ref >=4.8)
Large VLDL-P: 1.8 nmol/L (ref <=2.7)
Small LDL Particle Number: 729 nmol/L — ABNORMAL HIGH (ref <=527)
Triglycerides: 102 mg/dL (ref <150)
VLDL Size: 45.5 nm (ref <=46.6)

## 2013-08-10 ENCOUNTER — Telehealth: Payer: Self-pay | Admitting: Cardiology

## 2013-08-10 DIAGNOSIS — E782 Mixed hyperlipidemia: Secondary | ICD-10-CM

## 2013-08-10 MED ORDER — EZETIMIBE-SIMVASTATIN 10-20 MG PO TABS: 1.0000 | ORAL_TABLET | Freq: Every day | ORAL | Status: DC

## 2013-08-10 NOTE — Telephone Encounter (Signed)
Message copied by Alcario Drought on Fri Aug 10, 2013 11:05 AM ------      Message from: SMART, Maralyn Sago      Created: Wed Aug 01, 2013  6:11 PM       Patient with h/o CAD and diabetes.  He was previously in the IMPROVE-IT trial.  Currently on Vytorin 10/40 mg - 1/2 tablet daily.      Panel looks great.      Plan:      1. No change in meds.      2.  Recheck lipid panel and hepatic panel in 6 months.      Please notify patient, and set up labs. Thanks. ------

## 2013-08-10 NOTE — Telephone Encounter (Signed)
Pt notified. Labs ordered. Spoke with JEremy and pt can go from vytorin 10/40 1/2 tab to vytorin 10/20 1 tab.

## 2013-08-29 DIAGNOSIS — N35919 Unspecified urethral stricture, male, unspecified site: Secondary | ICD-10-CM | POA: Diagnosis not present

## 2013-09-13 DIAGNOSIS — M545 Low back pain, unspecified: Secondary | ICD-10-CM | POA: Diagnosis not present

## 2013-09-26 DIAGNOSIS — M545 Low back pain, unspecified: Secondary | ICD-10-CM | POA: Diagnosis not present

## 2013-10-11 DIAGNOSIS — E119 Type 2 diabetes mellitus without complications: Secondary | ICD-10-CM | POA: Diagnosis not present

## 2013-10-11 DIAGNOSIS — G250 Essential tremor: Secondary | ICD-10-CM | POA: Diagnosis not present

## 2013-10-11 DIAGNOSIS — I10 Essential (primary) hypertension: Secondary | ICD-10-CM | POA: Diagnosis not present

## 2013-10-11 DIAGNOSIS — Z23 Encounter for immunization: Secondary | ICD-10-CM | POA: Diagnosis not present

## 2013-10-11 DIAGNOSIS — Z0000 Encounter for general adult medical examination without abnormal findings: Secondary | ICD-10-CM | POA: Diagnosis not present

## 2013-10-11 DIAGNOSIS — L570 Actinic keratosis: Secondary | ICD-10-CM | POA: Diagnosis not present

## 2013-10-11 DIAGNOSIS — F411 Generalized anxiety disorder: Secondary | ICD-10-CM | POA: Diagnosis not present

## 2013-10-22 ENCOUNTER — Encounter: Payer: Self-pay | Admitting: Interventional Cardiology

## 2013-11-09 DIAGNOSIS — H35379 Puckering of macula, unspecified eye: Secondary | ICD-10-CM | POA: Diagnosis not present

## 2013-11-09 DIAGNOSIS — E119 Type 2 diabetes mellitus without complications: Secondary | ICD-10-CM | POA: Diagnosis not present

## 2013-12-11 DIAGNOSIS — I252 Old myocardial infarction: Secondary | ICD-10-CM | POA: Diagnosis not present

## 2013-12-11 DIAGNOSIS — J40 Bronchitis, not specified as acute or chronic: Secondary | ICD-10-CM | POA: Diagnosis not present

## 2013-12-11 DIAGNOSIS — J45909 Unspecified asthma, uncomplicated: Secondary | ICD-10-CM | POA: Diagnosis not present

## 2013-12-11 DIAGNOSIS — I2510 Atherosclerotic heart disease of native coronary artery without angina pectoris: Secondary | ICD-10-CM | POA: Diagnosis not present

## 2013-12-25 DIAGNOSIS — J45909 Unspecified asthma, uncomplicated: Secondary | ICD-10-CM | POA: Diagnosis not present

## 2013-12-25 DIAGNOSIS — H6980 Other specified disorders of Eustachian tube, unspecified ear: Secondary | ICD-10-CM | POA: Diagnosis not present

## 2014-01-21 DIAGNOSIS — J45909 Unspecified asthma, uncomplicated: Secondary | ICD-10-CM | POA: Diagnosis not present

## 2014-01-21 DIAGNOSIS — H6980 Other specified disorders of Eustachian tube, unspecified ear: Secondary | ICD-10-CM | POA: Diagnosis not present

## 2014-01-21 DIAGNOSIS — S239XXA Sprain of unspecified parts of thorax, initial encounter: Secondary | ICD-10-CM | POA: Diagnosis not present

## 2014-01-28 ENCOUNTER — Other Ambulatory Visit (INDEPENDENT_AMBULATORY_CARE_PROVIDER_SITE_OTHER): Payer: Medicare Other

## 2014-01-28 DIAGNOSIS — E782 Mixed hyperlipidemia: Secondary | ICD-10-CM

## 2014-01-28 LAB — LIPID PANEL
Cholesterol: 93 mg/dL (ref 0–200)
HDL: 32.4 mg/dL — AB (ref >39.00)
LDL Cholesterol: 37 mg/dL (ref 0–99)
NonHDL: 60.6
TRIGLYCERIDES: 117 mg/dL (ref 0.0–149.0)
Total CHOL/HDL Ratio: 3
VLDL: 23.4 mg/dL (ref 0.0–40.0)

## 2014-01-28 LAB — HEPATIC FUNCTION PANEL
ALBUMIN: 4.3 g/dL (ref 3.5–5.2)
ALK PHOS: 72 U/L (ref 39–117)
ALT: 24 U/L (ref 0–53)
AST: 20 U/L (ref 0–37)
Bilirubin, Direct: 0.2 mg/dL (ref 0.0–0.3)
TOTAL PROTEIN: 6.9 g/dL (ref 6.0–8.3)
Total Bilirubin: 1.4 mg/dL — ABNORMAL HIGH (ref 0.2–1.2)

## 2014-02-01 ENCOUNTER — Other Ambulatory Visit: Payer: Self-pay | Admitting: Cardiology

## 2014-02-01 DIAGNOSIS — E782 Mixed hyperlipidemia: Secondary | ICD-10-CM

## 2014-03-05 DIAGNOSIS — D485 Neoplasm of uncertain behavior of skin: Secondary | ICD-10-CM | POA: Diagnosis not present

## 2014-03-05 DIAGNOSIS — L570 Actinic keratosis: Secondary | ICD-10-CM | POA: Diagnosis not present

## 2014-03-05 DIAGNOSIS — C4441 Basal cell carcinoma of skin of scalp and neck: Secondary | ICD-10-CM | POA: Diagnosis not present

## 2014-03-05 DIAGNOSIS — L821 Other seborrheic keratosis: Secondary | ICD-10-CM | POA: Diagnosis not present

## 2014-03-15 DIAGNOSIS — Z885 Allergy status to narcotic agent status: Secondary | ICD-10-CM | POA: Diagnosis not present

## 2014-03-15 DIAGNOSIS — C4441 Basal cell carcinoma of skin of scalp and neck: Secondary | ICD-10-CM | POA: Diagnosis not present

## 2014-03-18 DIAGNOSIS — Z885 Allergy status to narcotic agent status: Secondary | ICD-10-CM | POA: Diagnosis not present

## 2014-03-18 DIAGNOSIS — C4441 Basal cell carcinoma of skin of scalp and neck: Secondary | ICD-10-CM | POA: Diagnosis not present

## 2014-03-29 DIAGNOSIS — Z48817 Encounter for surgical aftercare following surgery on the skin and subcutaneous tissue: Secondary | ICD-10-CM | POA: Diagnosis not present

## 2014-03-29 DIAGNOSIS — Z85828 Personal history of other malignant neoplasm of skin: Secondary | ICD-10-CM | POA: Diagnosis not present

## 2014-03-29 DIAGNOSIS — Z4802 Encounter for removal of sutures: Secondary | ICD-10-CM | POA: Diagnosis not present

## 2014-03-29 DIAGNOSIS — Z483 Aftercare following surgery for neoplasm: Secondary | ICD-10-CM | POA: Diagnosis not present

## 2014-04-03 DIAGNOSIS — Z87448 Personal history of other diseases of urinary system: Secondary | ICD-10-CM | POA: Diagnosis not present

## 2014-04-04 ENCOUNTER — Ambulatory Visit (INDEPENDENT_AMBULATORY_CARE_PROVIDER_SITE_OTHER): Payer: Medicare Other | Admitting: Interventional Cardiology

## 2014-04-04 ENCOUNTER — Encounter: Payer: Self-pay | Admitting: Interventional Cardiology

## 2014-04-04 VITALS — BP 130/82 | HR 64 | Ht 67.5 in | Wt 168.8 lb

## 2014-04-04 DIAGNOSIS — I252 Old myocardial infarction: Secondary | ICD-10-CM

## 2014-04-04 DIAGNOSIS — I10 Essential (primary) hypertension: Secondary | ICD-10-CM

## 2014-04-04 DIAGNOSIS — E782 Mixed hyperlipidemia: Secondary | ICD-10-CM | POA: Diagnosis not present

## 2014-04-04 DIAGNOSIS — I2510 Atherosclerotic heart disease of native coronary artery without angina pectoris: Secondary | ICD-10-CM

## 2014-04-04 NOTE — Progress Notes (Signed)
Patient ID: Jon Sanchez, male   DOB: October 11, 1940, 73 y.o.   MRN: [id number] Patient ID: Jon Sanchez, male   DOB: [birth year], 73 y.o.   MRN: [id number]    [place] [place], [place]  [address] Phone: [phone] Fax:  [phone]  Date:  04/04/2014   ID:  Jon Sanchez, Jon Sanchez 06/03/1941, MRN [id number]  PCP:   Melinda Crutch, MD      History of Present Illness: Jon Sanchez is a 73 y.o. male who has had a DES to the LAD in 5/07. He has been under stress due to caring for his elderly mother. CAD/ASCVD:  He has not been exercising that much. He has resumed walking one half hour per day, but has not been regular. Denies : Diaphoresis.  Dizziness.  Dyspnea on exertion.  Fatigue.  Leg edema.  Nitroglycerin.   No sx like his prior MI.     Wt Readings from Last 3 Encounters:  04/04/14 168 lb 12.8 oz (76.567 kg)  04/13/13 163 lb (73.936 kg)     Past Medical History  Diagnosis Date  . CAD (coronary artery disease)   . Chest pain   . GERD (gastroesophageal reflux disease)   . Myocardial infarct   . Pain in joint   . Osteoarthrosis, unspecified whether generalized or localized, hand   . Diabetes   . Allergic rhinitis   . Hyperlipidemia   . Anxiety   . Benign essential tremor   . Actinic keratoses     Current Outpatient Prescriptions  Medication Sig Dispense Refill  . B Complex Vitamins (VITAMIN B COMPLEX PO) Take 1 tablet by mouth daily.      . clopidogrel (PLAVIX) 75 MG tablet Take 1 tablet (75 mg total) by mouth daily.  90 tablet  3  . ezetimibe-simvastatin (VYTORIN) 10-20 MG per tablet Take 1 tablet by mouth daily.  90 tablet  2  . gabapentin (NEURONTIN) 300 MG capsule Take 300 mg by mouth daily as needed.      Marland Kitchen LORazepam (ATIVAN) 0.5 MG tablet Take 0.5 mg by mouth every 8 (eight) hours.      Marland Kitchen losartan (COZAAR) 25 MG tablet Take 1 tablet (25 mg total) by mouth daily.  90 tablet  3  . nitroGLYCERIN (NITROSTAT) 0.4 MG SL tablet Place 1 tablet  (0.4 mg total) under the tongue every 5 (five) minutes as needed for chest pain.  25 tablet  5  . Omega-3 Fatty Acids (FISH OIL PO) Take 1 tablet by mouth daily.      Marland Kitchen PROAIR HFA 108 (90 BASE) MCG/ACT inhaler       . propranolol ER (INDERAL LA) 60 MG 24 hr capsule Take 60 mg by mouth daily.      . sertraline (ZOLOFT) 100 MG tablet Take 50 mg by mouth daily. Take 50 mg daily       No current facility-administered medications for this visit.    Allergies:    Allergies  Allergen Reactions  . Codeine     Social History:  The patient  reports that he has never smoked. He does not have any smokeless tobacco history on file. He reports that he does not drink alcohol or use illicit drugs.   Family History:  The patient's family history includes Colon cancer in his mother; Coronary artery disease in an other family member; Heart attack in his father; Lung cancer in his father.   ROS:  Please see the history  of present illness.  No nausea, vomiting.  No fevers, chills.  No focal weakness.  No dysuria. Stress with family situation.  All other systems reviewed and negative.   PHYSICAL EXAM: VS:  BP 130/82  Pulse 64  Ht 5' 7.5" (1.715 m)  Wt 168 lb 12.8 oz (76.567 kg)  BMI 26.03 kg/m2 Well nourished, well developed, in no acute distress HEENT: normal Neck: no JVD, no carotid bruits Cardiac:  normal S1, S2; RRR;  Lungs:  clear to auscultation bilaterally, no wheezing, rhonchi or rales Abd: soft, nontender, no hepatomegaly Ext: no edema Skin: warm and dry Neuro:   no focal abnormalities noted  EKG:  Normal     ASSESSMENT AND PLAN:  Coronary atherosclerosis of native coronary artery  Continue Plavix Tablet, 75 MG, 1 tablet, Orally, Once a day, 90 day(s), 90, Refills 3 Continue Nitroglycerin Tablet Sublingual, 0.4 MG, 1 tablet under the tongue, Sublingual, as directed, 25, Refills 6 Diagnostic Imaging:EKG Harward,Amy 04/11/2012 08:51:24 AM > Sayan Aldava,JAY 04/11/2012 09:36:50 AM > NSR, no  ST segment changes  No angina.First generation DES in critical aea of his heart. WOuld continue plavix.  He has significant bruising, especially after a fall.  WIll stop aspirin. Normal stress test in 2008. No symptoms like what he had prior to his stent.    2. Mixed hyperlipidemia Was inresearch study. Cholesterol was regulated by [employer],  Finished.  He was started on Vytorin- dose decreased ... 8/15 lipid panel with LDL 37.  He will need a lipid panel in Feb.    3. Old myocardial infarction  Continue Aspirin Tablet Delayed Release, 81 MG, 1 tablet, Orally, Once a day Continue Propranolol HCl CR Capsule Extended Release 24 Hour, 60 MG, TAKE 1 CAPSULE BY MOUTH EVERY DAY No CHF.    4. Benign hypertension  Continue Losartan Potassium Tablet, 25 MG, 1 tablet, Orally, Once a day, 90 day(s), 90, Refills 3 Cough with lisinopril. Diastolics much better on meds.  Controlled at home.    Preventive Medicine  Adult topics discussed:  Diet: healthy diet, low calorie, low fat.  Exercise: at least 30 minutes of aerobic exercise, 5 days a week.      Signed, Mina Marble, MD, [facility] 04/04/2014 9:13 AM

## 2014-04-04 NOTE — Patient Instructions (Signed)
Your physician recommends that you continue on your current medications as directed. Please refer to the Current Medication list given to you today.  Your physician wants you to follow-up in: 1 year with Dr. Varanasi. You will receive a reminder letter in the mail two months in advance. If you don't receive a letter, please call our office to schedule the follow-up appointment.  

## 2014-04-11 DIAGNOSIS — J0140 Acute pansinusitis, unspecified: Secondary | ICD-10-CM | POA: Diagnosis not present

## 2014-05-14 DIAGNOSIS — Z23 Encounter for immunization: Secondary | ICD-10-CM | POA: Diagnosis not present

## 2014-06-06 DIAGNOSIS — E119 Type 2 diabetes mellitus without complications: Secondary | ICD-10-CM | POA: Diagnosis not present

## 2014-06-06 DIAGNOSIS — F419 Anxiety disorder, unspecified: Secondary | ICD-10-CM | POA: Diagnosis not present

## 2014-06-12 ENCOUNTER — Other Ambulatory Visit: Payer: Self-pay | Admitting: *Deleted

## 2014-06-12 MED ORDER — EZETIMIBE-SIMVASTATIN 10-20 MG PO TABS: 1.0000 | ORAL_TABLET | Freq: Every day | ORAL | Status: DC

## 2014-06-20 ENCOUNTER — Other Ambulatory Visit: Payer: Self-pay | Admitting: Interventional Cardiology

## 2014-06-20 MED ORDER — LOSARTAN POTASSIUM 25 MG PO TABS
25.0000 mg | ORAL_TABLET | Freq: Every day | ORAL | Status: DC
Start: 2014-06-20 — End: 2015-06-09

## 2014-06-26 DIAGNOSIS — J209 Acute bronchitis, unspecified: Secondary | ICD-10-CM | POA: Diagnosis not present

## 2014-07-09 DIAGNOSIS — Z23 Encounter for immunization: Secondary | ICD-10-CM | POA: Diagnosis not present

## 2014-07-09 DIAGNOSIS — F419 Anxiety disorder, unspecified: Secondary | ICD-10-CM | POA: Diagnosis not present

## 2014-07-09 DIAGNOSIS — R040 Epistaxis: Secondary | ICD-10-CM | POA: Diagnosis not present

## 2014-07-16 ENCOUNTER — Telehealth: Payer: Self-pay | Admitting: Interventional Cardiology

## 2014-07-16 NOTE — Telephone Encounter (Signed)
Walk in script " pt needs refill " gave to refill dept

## 2014-07-17 ENCOUNTER — Other Ambulatory Visit: Payer: Self-pay

## 2014-07-17 MED ORDER — CLOPIDOGREL BISULFATE 75 MG PO TABS: 75.0000 mg | ORAL_TABLET | Freq: Every day | ORAL | Status: DC

## 2014-07-30 DIAGNOSIS — J209 Acute bronchitis, unspecified: Secondary | ICD-10-CM | POA: Diagnosis not present

## 2014-07-31 ENCOUNTER — Other Ambulatory Visit: Payer: Medicare Other

## 2014-08-06 ENCOUNTER — Other Ambulatory Visit (INDEPENDENT_AMBULATORY_CARE_PROVIDER_SITE_OTHER): Payer: Medicare Other | Admitting: *Deleted

## 2014-08-06 DIAGNOSIS — E782 Mixed hyperlipidemia: Secondary | ICD-10-CM

## 2014-08-06 LAB — HEPATIC FUNCTION PANEL
ALBUMIN: 4.2 g/dL (ref 3.5–5.2)
ALK PHOS: 74 U/L (ref 39–117)
ALT: 19 U/L (ref 0–53)
AST: 14 U/L (ref 0–37)
BILIRUBIN DIRECT: 0.2 mg/dL (ref 0.0–0.3)
TOTAL PROTEIN: 6.5 g/dL (ref 6.0–8.3)
Total Bilirubin: 1 mg/dL (ref 0.2–1.2)

## 2014-08-06 LAB — LIPID PANEL
Cholesterol: 107 mg/dL (ref 0–200)
HDL: 34.1 mg/dL — AB (ref >39.00)
LDL CALC: 50 mg/dL (ref 0–99)
NONHDL: 72.9
Total CHOL/HDL Ratio: 3
Triglycerides: 117 mg/dL (ref 0.0–149.0)
VLDL: 23.4 mg/dL (ref 0.0–40.0)

## 2014-10-10 DIAGNOSIS — Z87448 Personal history of other diseases of urinary system: Secondary | ICD-10-CM | POA: Diagnosis not present

## 2014-10-14 DIAGNOSIS — E782 Mixed hyperlipidemia: Secondary | ICD-10-CM | POA: Diagnosis not present

## 2014-10-14 DIAGNOSIS — F419 Anxiety disorder, unspecified: Secondary | ICD-10-CM | POA: Diagnosis not present

## 2014-10-14 DIAGNOSIS — E119 Type 2 diabetes mellitus without complications: Secondary | ICD-10-CM | POA: Diagnosis not present

## 2014-10-14 DIAGNOSIS — Z125 Encounter for screening for malignant neoplasm of prostate: Secondary | ICD-10-CM | POA: Diagnosis not present

## 2014-10-14 DIAGNOSIS — I10 Essential (primary) hypertension: Secondary | ICD-10-CM | POA: Diagnosis not present

## 2014-10-15 DIAGNOSIS — I10 Essential (primary) hypertension: Secondary | ICD-10-CM | POA: Diagnosis not present

## 2014-10-15 DIAGNOSIS — Z0001 Encounter for general adult medical examination with abnormal findings: Secondary | ICD-10-CM | POA: Diagnosis not present

## 2014-10-15 DIAGNOSIS — E119 Type 2 diabetes mellitus without complications: Secondary | ICD-10-CM | POA: Diagnosis not present

## 2014-10-15 DIAGNOSIS — Z125 Encounter for screening for malignant neoplasm of prostate: Secondary | ICD-10-CM | POA: Diagnosis not present

## 2014-10-15 DIAGNOSIS — E782 Mixed hyperlipidemia: Secondary | ICD-10-CM | POA: Diagnosis not present

## 2014-10-15 DIAGNOSIS — G250 Essential tremor: Secondary | ICD-10-CM | POA: Diagnosis not present

## 2014-10-15 DIAGNOSIS — F419 Anxiety disorder, unspecified: Secondary | ICD-10-CM | POA: Diagnosis not present

## 2014-10-21 DIAGNOSIS — R112 Nausea with vomiting, unspecified: Secondary | ICD-10-CM | POA: Diagnosis not present

## 2014-10-29 DIAGNOSIS — E119 Type 2 diabetes mellitus without complications: Secondary | ICD-10-CM | POA: Diagnosis not present

## 2014-10-29 DIAGNOSIS — H01004 Unspecified blepharitis left upper eyelid: Secondary | ICD-10-CM | POA: Diagnosis not present

## 2014-10-29 DIAGNOSIS — H01001 Unspecified blepharitis right upper eyelid: Secondary | ICD-10-CM | POA: Diagnosis not present

## 2014-11-05 ENCOUNTER — Other Ambulatory Visit: Payer: Self-pay | Admitting: Interventional Cardiology

## 2014-11-13 DIAGNOSIS — H02423 Myogenic ptosis of bilateral eyelids: Secondary | ICD-10-CM | POA: Diagnosis not present

## 2014-12-15 DIAGNOSIS — J020 Streptococcal pharyngitis: Secondary | ICD-10-CM | POA: Diagnosis not present

## 2014-12-15 DIAGNOSIS — J029 Acute pharyngitis, unspecified: Secondary | ICD-10-CM | POA: Diagnosis not present

## 2014-12-27 DIAGNOSIS — J209 Acute bronchitis, unspecified: Secondary | ICD-10-CM | POA: Diagnosis not present

## 2015-01-18 ENCOUNTER — Other Ambulatory Visit: Payer: Self-pay | Admitting: Interventional Cardiology

## 2015-02-06 ENCOUNTER — Encounter: Payer: Self-pay | Admitting: Interventional Cardiology

## 2015-02-11 DIAGNOSIS — K146 Glossodynia: Secondary | ICD-10-CM | POA: Diagnosis not present

## 2015-02-11 DIAGNOSIS — I781 Nevus, non-neoplastic: Secondary | ICD-10-CM | POA: Diagnosis not present

## 2015-02-11 DIAGNOSIS — Z85828 Personal history of other malignant neoplasm of skin: Secondary | ICD-10-CM | POA: Diagnosis not present

## 2015-02-11 DIAGNOSIS — L230 Allergic contact dermatitis due to metals: Secondary | ICD-10-CM | POA: Diagnosis not present

## 2015-02-11 DIAGNOSIS — L570 Actinic keratosis: Secondary | ICD-10-CM | POA: Diagnosis not present

## 2015-02-11 DIAGNOSIS — D1800 Hemangioma unspecified site: Secondary | ICD-10-CM | POA: Diagnosis not present

## 2015-02-11 DIAGNOSIS — B078 Other viral warts: Secondary | ICD-10-CM | POA: Diagnosis not present

## 2015-02-11 DIAGNOSIS — L821 Other seborrheic keratosis: Secondary | ICD-10-CM | POA: Diagnosis not present

## 2015-02-13 DIAGNOSIS — J029 Acute pharyngitis, unspecified: Secondary | ICD-10-CM | POA: Diagnosis not present

## 2015-03-05 DIAGNOSIS — Z23 Encounter for immunization: Secondary | ICD-10-CM | POA: Diagnosis not present

## 2015-03-05 DIAGNOSIS — H9190 Unspecified hearing loss, unspecified ear: Secondary | ICD-10-CM | POA: Diagnosis not present

## 2015-03-05 DIAGNOSIS — E119 Type 2 diabetes mellitus without complications: Secondary | ICD-10-CM | POA: Diagnosis not present

## 2015-03-05 DIAGNOSIS — F419 Anxiety disorder, unspecified: Secondary | ICD-10-CM | POA: Diagnosis not present

## 2015-03-05 DIAGNOSIS — I10 Essential (primary) hypertension: Secondary | ICD-10-CM | POA: Diagnosis not present

## 2015-03-31 DIAGNOSIS — H9042 Sensorineural hearing loss, unilateral, left ear, with unrestricted hearing on the contralateral side: Secondary | ICD-10-CM | POA: Diagnosis not present

## 2015-03-31 DIAGNOSIS — H903 Sensorineural hearing loss, bilateral: Secondary | ICD-10-CM | POA: Diagnosis not present

## 2015-03-31 DIAGNOSIS — H9313 Tinnitus, bilateral: Secondary | ICD-10-CM | POA: Diagnosis not present

## 2015-03-31 DIAGNOSIS — H918X1 Other specified hearing loss, right ear: Secondary | ICD-10-CM | POA: Diagnosis not present

## 2015-03-31 DIAGNOSIS — H9311 Tinnitus, right ear: Secondary | ICD-10-CM | POA: Diagnosis not present

## 2015-04-01 ENCOUNTER — Encounter: Payer: Medicare Other | Attending: Family Medicine

## 2015-04-01 VITALS — Ht 67.0 in | Wt 166.6 lb

## 2015-04-01 DIAGNOSIS — E118 Type 2 diabetes mellitus with unspecified complications: Secondary | ICD-10-CM | POA: Diagnosis not present

## 2015-04-01 DIAGNOSIS — Z713 Dietary counseling and surveillance: Secondary | ICD-10-CM | POA: Insufficient documentation

## 2015-04-03 ENCOUNTER — Other Ambulatory Visit: Payer: Self-pay | Admitting: Otolaryngology

## 2015-04-03 DIAGNOSIS — H9313 Tinnitus, bilateral: Secondary | ICD-10-CM

## 2015-04-03 DIAGNOSIS — H918X1 Other specified hearing loss, right ear: Secondary | ICD-10-CM

## 2015-04-03 DIAGNOSIS — IMO0001 Reserved for inherently not codable concepts without codable children: Secondary | ICD-10-CM

## 2015-04-04 NOTE — Progress Notes (Signed)

## 2015-04-08 DIAGNOSIS — Z713 Dietary counseling and surveillance: Secondary | ICD-10-CM | POA: Diagnosis not present

## 2015-04-08 DIAGNOSIS — E1165 Type 2 diabetes mellitus with hyperglycemia: Secondary | ICD-10-CM

## 2015-04-08 DIAGNOSIS — E118 Type 2 diabetes mellitus with unspecified complications: Principal | ICD-10-CM

## 2015-04-08 DIAGNOSIS — IMO0002 Reserved for concepts with insufficient information to code with codable children: Secondary | ICD-10-CM

## 2015-04-09 NOTE — Progress Notes (Signed)

## 2015-04-14 ENCOUNTER — Encounter: Payer: Self-pay | Admitting: Interventional Cardiology

## 2015-04-14 ENCOUNTER — Ambulatory Visit (INDEPENDENT_AMBULATORY_CARE_PROVIDER_SITE_OTHER): Payer: Medicare Other | Admitting: Interventional Cardiology

## 2015-04-14 VITALS — BP 110/58 | HR 71 | Ht 67.0 in | Wt 160.0 lb

## 2015-04-14 DIAGNOSIS — E782 Mixed hyperlipidemia: Secondary | ICD-10-CM | POA: Diagnosis not present

## 2015-04-14 DIAGNOSIS — I252 Old myocardial infarction: Secondary | ICD-10-CM

## 2015-04-14 DIAGNOSIS — I10 Essential (primary) hypertension: Secondary | ICD-10-CM | POA: Diagnosis not present

## 2015-04-14 DIAGNOSIS — I2510 Atherosclerotic heart disease of native coronary artery without angina pectoris: Secondary | ICD-10-CM

## 2015-04-14 DIAGNOSIS — E1159 Type 2 diabetes mellitus with other circulatory complications: Secondary | ICD-10-CM

## 2015-04-14 DIAGNOSIS — E119 Type 2 diabetes mellitus without complications: Secondary | ICD-10-CM | POA: Insufficient documentation

## 2015-04-14 NOTE — Patient Instructions (Signed)
Medication Instructions:  Same-no changes  Labwork: None  Testing/Procedures: None  Follow-Up: Your physician wants you to follow-up in: 1 year. You will receive a reminder letter in the mail two months in advance. If you don't receive a letter, please call our office to schedule the follow-up appointment.      

## 2015-04-14 NOTE — Progress Notes (Signed)
Patient ID: Jon Sanchez, male   DOB: [birth year], 74 y.o.   MRN: [id number]     Cardiology Office Note   Date:  04/14/2015   ID:  Jon, Sanchez 1940-09-29, MRN [id number]  PCP:   Melinda Crutch, MD    No chief complaint on file. CAD   Wt Readings from Last 3 Encounters:  04/14/15 160 lb (72.576 kg)  04/01/15 166 lb 9.6 oz (75.569 kg)  04/04/14 168 lb 12.8 oz (76.567 kg)       History of Present Illness: Jon Sanchez is a 74 y.o. male  who has had a DES to the LAD in 5/07. He has hearing loss and needs an MRI.  He has tinnitus in the right tear. CAD/ASCVD:  He has not been exercising that much. He has resumed walking one half hour per day, but has not been regular. Denies : Diaphoresis.  Dizziness.  Dyspnea on exertion.  Fatigue.  Leg edema.  Nitroglycerin.   No sx like his prior MI.   He now is on Metformin.  He is taking nutritional classes for diet improvement. He has lost weight intentionally.  His HbA1C was 8 off of metformin.      Past Medical History  Diagnosis Date  . CAD (coronary artery disease)   . Chest pain   . GERD (gastroesophageal reflux disease)   . Myocardial infarct ([place])   . Pain in joint   . Osteoarthrosis, unspecified whether generalized or localized, hand   . Diabetes ([place])   . Allergic rhinitis   . Hyperlipidemia   . Anxiety   . Benign essential tremor   . Actinic keratoses     Past Surgical History  Procedure Laterality Date  . Coronary artery bypass graft       Current Outpatient Prescriptions  Medication Sig Dispense Refill  . B Complex Vitamins (VITAMIN B COMPLEX PO) Take 1 tablet by mouth daily.    . clopidogrel (PLAVIX) 75 MG tablet TAKE 1 TABLET (75 MG TOTAL) BY MOUTH DAILY. 90 tablet 0  . ezetimibe-simvastatin (VYTORIN) 10-20 MG per tablet Take 1 tablet by mouth daily. 90 tablet 3  . gabapentin (NEURONTIN) 300 MG capsule Take 300 mg by mouth daily as needed (HERPES ON LIP).     . LORazepam (ATIVAN) 0.5 MG  tablet Take 0.5 mg by mouth every 8 (eight) hours.    Marland Kitchen losartan (COZAAR) 25 MG tablet Take 1 tablet (25 mg total) by mouth daily. 90 tablet 3  . metFORMIN (GLUCOPHAGE-XR) 500 MG 24 hr tablet Take 500 mg by mouth daily with breakfast.    . NITROSTAT 0.4 MG SL tablet PLACE 1 TABLET UNDER THE TONGUE EVERY 5 MINUTES AS NEEDED FOR CHEST PAIN 25 tablet 1  . Omega-3 Fatty Acids (FISH OIL PO) Take 1 tablet by mouth daily.    Marland Kitchen PROAIR HFA 108 (90 BASE) MCG/ACT inhaler Inhale 2 puffs into the lungs every 6 (six) hours as needed (WHEEZING).     Marland Kitchen propranolol ER (INDERAL LA) 60 MG 24 hr capsule Take 60 mg by mouth daily.    . sertraline (ZOLOFT) 100 MG tablet Take 50 mg by mouth daily. Take 50 mg daily     No current facility-administered medications for this visit.    Allergies:   Codeine    Social History:  The patient  reports that he has never smoked. He does not have any smokeless tobacco history on file. He reports that he does not drink alcohol  or use illicit drugs.   Family History:  The patient's family history includes Colon cancer in his mother; Coronary artery disease in an other family member; Heart attack in his father; Hypertension in his mother; Lung cancer in his father. There is no history of Stroke.    ROS:  Please see the history of present illness.   Otherwise, review of systems are positive for hearing loss.   All other systems are reviewed and negative.    PHYSICAL EXAM: VS:  BP 110/58 mmHg  Pulse 71  Ht 5\' 7"  (1.702 m)  Wt 160 lb (72.576 kg)  BMI 25.05 kg/m2 , BMI Body mass index is 25.05 kg/(m^2). GEN: Well nourished, well developed, in no acute distress HEENT: normal Neck: no JVD, carotid bruits, or masses Cardiac: RRR; no murmurs, rubs, or gallops,no edema  Respiratory:  clear to auscultation bilaterally, normal work of breathing GI: soft, nontender, nondistended, + BS MS: no deformity or atrophy Skin: warm and dry, no rash Neuro:  Strength and sensation are  intact Psych: euthymic mood, full affect   EKG:   The ekg ordered today demonstrates normal   Recent Labs: 08/06/2014: ALT 19   Lipid Panel    Component Value Date/Time   CHOL 107 08/06/2014 1007   CHOL 99 07/27/2013 0907   TRIG 117.0 08/06/2014 1007   TRIG 102 07/27/2013 0907   HDL 34.10* 08/06/2014 1007   HDL 35* 07/27/2013 0907   CHOLHDL 3 08/06/2014 1007   VLDL 23.4 08/06/2014 1007   LDLCALC 50 08/06/2014 1007   LDLCALC 44 07/27/2013 0907     Other studies Reviewed: Additional studies/ records that were reviewed today with results demonstrating: lipid results from 2/16.   ASSESSMENT AND PLAN:  1. CAD: Continue clopidogrel 75 mg daily.  NTG prn.  No angina.First generation DES in critical aea of his heart. WOuld continue plavix. He has not had significant bruising, no recent falls. Stopped aspirin. Normal stress test in 2008. No symptoms like what he had prior to his stent.  2. Mixed hyperlipidemia:  Controlled in 2/16. 3. Old MI: No CHF. 4. Diabetes mellitus: On metformin. Managed by PMD. 5. Hypertension: BP well controlled. Continue ARB given DM.    Current medicines are reviewed at length with the patient today.  The patient concerns regarding his medicines were addressed.  The following changes have been made:  No change  Labs/ tests ordered today include:  No orders of the defined types were placed in this encounter.    Recommend 150 minutes/week of aerobic exercise Low fat, low carb, high fiber diet recommended  Disposition:   FU in 1 year   Teresita Madura., MD  04/14/2015 8:23 AM    [facility] [address] [place], [place], [place]  [address] Phone: [phone]; Fax: [phone]

## 2015-04-15 DIAGNOSIS — Z713 Dietary counseling and surveillance: Secondary | ICD-10-CM | POA: Diagnosis not present

## 2015-04-15 DIAGNOSIS — E1159 Type 2 diabetes mellitus with other circulatory complications: Secondary | ICD-10-CM

## 2015-04-15 DIAGNOSIS — E118 Type 2 diabetes mellitus with unspecified complications: Secondary | ICD-10-CM | POA: Diagnosis not present

## 2015-04-16 ENCOUNTER — Other Ambulatory Visit: Payer: Self-pay | Admitting: Interventional Cardiology

## 2015-04-17 ENCOUNTER — Ambulatory Visit
Admission: RE | Admit: 2015-04-17 | Discharge: 2015-04-17 | Disposition: A | Discharge status: Home / Self Care | Payer: Medicare Other | Source: Ambulatory Visit | Attending: Otolaryngology | Admitting: Otolaryngology

## 2015-04-17 DIAGNOSIS — H9191 Unspecified hearing loss, right ear: Secondary | ICD-10-CM | POA: Diagnosis not present

## 2015-04-17 DIAGNOSIS — IMO0001 Reserved for inherently not codable concepts without codable children: Secondary | ICD-10-CM

## 2015-04-17 DIAGNOSIS — H918X1 Other specified hearing loss, right ear: Secondary | ICD-10-CM

## 2015-04-17 DIAGNOSIS — H9313 Tinnitus, bilateral: Secondary | ICD-10-CM

## 2015-04-17 MED ORDER — GADOBENATE DIMEGLUMINE 529 MG/ML IV SOLN
15.0000 mL | Freq: Once | INTRAVENOUS | Status: AC | PRN
  Administered 2015-04-17: 15 mL via INTRAVENOUS

## 2015-04-17 NOTE — Progress Notes (Signed)
Patient was seen on 04/15/15 for the third of a series of three diabetes self-management courses at the [facility].   Jon Sanchez the amount of activity recommended for healthy living . Describe activities suitable for individual needs . Identify ways to regularly incorporate activity into daily life . Identify barriers to activity and ways to over come these barriers  Identify diabetes medications being personally used and their primary action for lowering glucose and possible side effects . Describe role of stress on blood glucose and develop strategies to address psychosocial issues . Identify diabetes complications and ways to prevent them  Explain how to manage diabetes during illness . Evaluate success in meeting personal goal . Establish 2-3 goals that they will plan to diligently work on until they return for the  67-month follow-up visit  Goals:   I will be active 60 minutes or more 5 times a week  I will test my glucose at least 1 times a day, 7 days a week  Your patient has identified these potential barriers to change:  Motivation Stress  Your patient has identified their diabetes self-care support plan as  [employer] On-line Resources Plan:  Attend Optional Core 4 in 4 months

## 2015-05-06 DIAGNOSIS — H0014 Chalazion left upper eyelid: Secondary | ICD-10-CM | POA: Diagnosis not present

## 2015-05-20 DIAGNOSIS — H0014 Chalazion left upper eyelid: Secondary | ICD-10-CM | POA: Diagnosis not present

## 2015-05-26 ENCOUNTER — Telehealth: Payer: Self-pay | Admitting: *Deleted

## 2015-05-26 DIAGNOSIS — E782 Mixed hyperlipidemia: Secondary | ICD-10-CM

## 2015-05-26 NOTE — Telephone Encounter (Signed)
Patient left a voicemail on the refill line stating that the vytorin is $374 at one pharmacy and $225 at another. He would like to know if he could be switched to something more affordable or if this could be changed to the two medications that make up vytorin. Please advise. Thanks, MI

## 2015-05-27 NOTE — Telephone Encounter (Signed)
I would stop vytorin and start atorvastatin 20 mg daily.  Then recheck lipids/LFTs in 3 months.

## 2015-05-27 NOTE — Telephone Encounter (Signed)
The pt is taking Vytorin 10/20mg  daily. Please advise.

## 2015-05-27 NOTE — Telephone Encounter (Signed)
No answer at the pts home phone number. I left a message on the pts cell phone VM asking him to call me back.

## 2015-06-02 ENCOUNTER — Telehealth: Payer: Self-pay | Admitting: Interventional Cardiology

## 2015-06-02 MED ORDER — ATORVASTATIN CALCIUM 20 MG PO TABS: 20.0000 mg | ORAL_TABLET | Freq: Every day | ORAL | Status: DC

## 2015-06-02 NOTE — Telephone Encounter (Signed)
New message ° ° ° ° ° °Returning a nurses call from last week °

## 2015-06-02 NOTE — Telephone Encounter (Signed)
Spoke with pt and informed him of changes per Dr. Irish Lack.  Pt verbalized understanding and was in agreement with this plan. Scheduled labs for 09/01/15.

## 2015-06-02 NOTE — Telephone Encounter (Signed)
See phone note from 05/26/15

## 2015-06-03 DIAGNOSIS — H02833 Dermatochalasis of right eye, unspecified eyelid: Secondary | ICD-10-CM | POA: Diagnosis not present

## 2015-06-03 DIAGNOSIS — H02423 Myogenic ptosis of bilateral eyelids: Secondary | ICD-10-CM | POA: Diagnosis not present

## 2015-06-03 DIAGNOSIS — Z7901 Long term (current) use of anticoagulants: Secondary | ICD-10-CM | POA: Diagnosis not present

## 2015-06-03 DIAGNOSIS — H18413 Arcus senilis, bilateral: Secondary | ICD-10-CM | POA: Diagnosis not present

## 2015-06-03 DIAGNOSIS — E119 Type 2 diabetes mellitus without complications: Secondary | ICD-10-CM | POA: Diagnosis not present

## 2015-06-03 DIAGNOSIS — H02836 Dermatochalasis of left eye, unspecified eyelid: Secondary | ICD-10-CM | POA: Diagnosis not present

## 2015-06-03 DIAGNOSIS — Z885 Allergy status to narcotic agent status: Secondary | ICD-10-CM | POA: Diagnosis not present

## 2015-06-09 ENCOUNTER — Other Ambulatory Visit: Payer: Self-pay | Admitting: Interventional Cardiology

## 2015-07-03 DIAGNOSIS — E119 Type 2 diabetes mellitus without complications: Secondary | ICD-10-CM | POA: Diagnosis not present

## 2015-07-03 DIAGNOSIS — F419 Anxiety disorder, unspecified: Secondary | ICD-10-CM | POA: Diagnosis not present

## 2015-07-03 DIAGNOSIS — Z7984 Long term (current) use of oral hypoglycemic drugs: Secondary | ICD-10-CM | POA: Diagnosis not present

## 2015-07-09 DIAGNOSIS — Z86010 Personal history of colonic polyps: Secondary | ICD-10-CM | POA: Diagnosis not present

## 2015-07-09 DIAGNOSIS — Z860101 Personal history of adenomatous and serrated colon polyps: Secondary | ICD-10-CM | POA: Insufficient documentation

## 2015-07-09 DIAGNOSIS — R079 Chest pain, unspecified: Secondary | ICD-10-CM | POA: Diagnosis not present

## 2015-07-09 DIAGNOSIS — K219 Gastro-esophageal reflux disease without esophagitis: Secondary | ICD-10-CM | POA: Diagnosis not present

## 2015-07-25 ENCOUNTER — Telehealth: Payer: Self-pay | Admitting: Interventional Cardiology

## 2015-07-25 NOTE — Telephone Encounter (Signed)
New Message:  Request for surgical clearance:  1. What type of surgery is being performed?  Colonscopy   2. When is this surgery scheduled? 2/10   3. Are there any medications that need to be held prior to surgery and how long? Need Plavix held 5 days prior    4. Name of physician performing surgery? Dr. Darlis Loan    5. What is your office phone and fax number? Office- [phone] 6.

## 2015-07-25 NOTE — Telephone Encounter (Signed)
Will forward to Dr. Varanasi for review and advisement.  

## 2015-07-28 NOTE — Telephone Encounter (Signed)
Follow up      Pt is calling because he is having a colonoscopy on Friday.  He said we have not responded to the surgical clearance request. He stopped his plavix and fish oil on last Friday because he needs to be off of it for 5 days.  Please call Dr Ferdinand Lango off with clearance as soon as you can.

## 2015-07-28 NOTE — Telephone Encounter (Signed)
Dr Irish Lack reviewed message in regards to clearance and holding plavix.  Per Dr Irish Lack the pt can proceed with colonoscopy and he can hold plavix for 5 days prior to procedure and resume ASAP after procedure when safe from Dr Ruthell Rummage stand point.  I spoke with the pt and made him aware of this information.  I also contacted Leda Gauze at Dr Ruthell Rummage office and made her aware.

## 2015-08-01 DIAGNOSIS — D122 Benign neoplasm of ascending colon: Secondary | ICD-10-CM | POA: Diagnosis not present

## 2015-08-01 DIAGNOSIS — Z86010 Personal history of colonic polyps: Secondary | ICD-10-CM | POA: Diagnosis not present

## 2015-08-01 DIAGNOSIS — K2970 Gastritis, unspecified, without bleeding: Secondary | ICD-10-CM | POA: Diagnosis not present

## 2015-08-01 DIAGNOSIS — K5730 Diverticulosis of large intestine without perforation or abscess without bleeding: Secondary | ICD-10-CM | POA: Diagnosis not present

## 2015-08-01 DIAGNOSIS — K219 Gastro-esophageal reflux disease without esophagitis: Secondary | ICD-10-CM | POA: Diagnosis not present

## 2015-08-01 DIAGNOSIS — R1310 Dysphagia, unspecified: Secondary | ICD-10-CM | POA: Diagnosis not present

## 2015-08-01 DIAGNOSIS — D123 Benign neoplasm of transverse colon: Secondary | ICD-10-CM | POA: Diagnosis not present

## 2015-08-19 DIAGNOSIS — B079 Viral wart, unspecified: Secondary | ICD-10-CM | POA: Diagnosis not present

## 2015-08-19 DIAGNOSIS — Z85828 Personal history of other malignant neoplasm of skin: Secondary | ICD-10-CM | POA: Diagnosis not present

## 2015-08-19 DIAGNOSIS — L570 Actinic keratosis: Secondary | ICD-10-CM | POA: Diagnosis not present

## 2015-08-19 DIAGNOSIS — R238 Other skin changes: Secondary | ICD-10-CM | POA: Diagnosis not present

## 2015-08-19 DIAGNOSIS — B078 Other viral warts: Secondary | ICD-10-CM | POA: Diagnosis not present

## 2015-08-19 DIAGNOSIS — K146 Glossodynia: Secondary | ICD-10-CM | POA: Diagnosis not present

## 2015-09-01 ENCOUNTER — Other Ambulatory Visit (INDEPENDENT_AMBULATORY_CARE_PROVIDER_SITE_OTHER): Payer: Medicare Other | Admitting: *Deleted

## 2015-09-01 ENCOUNTER — Telehealth: Payer: Self-pay

## 2015-09-01 DIAGNOSIS — E782 Mixed hyperlipidemia: Secondary | ICD-10-CM | POA: Diagnosis not present

## 2015-09-01 LAB — HEPATIC FUNCTION PANEL
ALK PHOS: 88 U/L (ref 40–115)
ALT: 16 U/L (ref 9–46)
AST: 12 U/L (ref 10–35)
Albumin: 4.3 g/dL (ref 3.6–5.1)
BILIRUBIN INDIRECT: 0.8 mg/dL (ref 0.2–1.2)
BILIRUBIN TOTAL: 1.1 mg/dL (ref 0.2–1.2)
Bilirubin, Direct: 0.3 mg/dL — ABNORMAL HIGH (ref <=0.2)
TOTAL PROTEIN: 6.1 g/dL (ref 6.1–8.1)

## 2015-09-01 LAB — LIPID PANEL
Cholesterol: 90 mg/dL — ABNORMAL LOW (ref 125–200)
HDL: 28 mg/dL — ABNORMAL LOW (ref >=40)
LDL CALC: 40 mg/dL (ref <130)
TRIGLYCERIDES: 112 mg/dL (ref <150)
Total CHOL/HDL Ratio: 3.2 Ratio (ref <=5.0)
VLDL: 22 mg/dL (ref <30)

## 2015-09-01 NOTE — Telephone Encounter (Signed)
OK to hold aspirin and plavix for a week prior to surgery.

## 2015-09-01 NOTE — Telephone Encounter (Signed)
I manually faxed this message to [facility] at [phone]. I did receive confirmation that the fax went successfully.

## 2015-09-01 NOTE — Telephone Encounter (Signed)
The pt is scheduled to have Levator Resection Blepharoplasty with [facility] on 09/05/15. They are requesting that the pt stop taking Plavix and Asa 1 week prior to surgery. Please advise.

## 2015-09-05 DIAGNOSIS — Z79899 Other long term (current) drug therapy: Secondary | ICD-10-CM | POA: Diagnosis not present

## 2015-09-05 DIAGNOSIS — N401 Enlarged prostate with lower urinary tract symptoms: Secondary | ICD-10-CM | POA: Diagnosis not present

## 2015-09-05 DIAGNOSIS — I2510 Atherosclerotic heart disease of native coronary artery without angina pectoris: Secondary | ICD-10-CM | POA: Diagnosis not present

## 2015-09-05 DIAGNOSIS — H02403 Unspecified ptosis of bilateral eyelids: Secondary | ICD-10-CM | POA: Diagnosis not present

## 2015-09-05 DIAGNOSIS — N359 Urethral stricture, unspecified: Secondary | ICD-10-CM | POA: Diagnosis not present

## 2015-09-05 DIAGNOSIS — E785 Hyperlipidemia, unspecified: Secondary | ICD-10-CM | POA: Diagnosis not present

## 2015-09-05 DIAGNOSIS — Z7984 Long term (current) use of oral hypoglycemic drugs: Secondary | ICD-10-CM | POA: Diagnosis not present

## 2015-09-05 DIAGNOSIS — K146 Glossodynia: Secondary | ICD-10-CM | POA: Diagnosis not present

## 2015-09-05 DIAGNOSIS — Z885 Allergy status to narcotic agent status: Secondary | ICD-10-CM | POA: Diagnosis not present

## 2015-09-05 DIAGNOSIS — Z955 Presence of coronary angioplasty implant and graft: Secondary | ICD-10-CM | POA: Diagnosis not present

## 2015-09-05 DIAGNOSIS — I10 Essential (primary) hypertension: Secondary | ICD-10-CM | POA: Diagnosis not present

## 2015-09-05 DIAGNOSIS — H539 Unspecified visual disturbance: Secondary | ICD-10-CM | POA: Diagnosis not present

## 2015-09-05 DIAGNOSIS — F419 Anxiety disorder, unspecified: Secondary | ICD-10-CM | POA: Diagnosis not present

## 2015-09-05 DIAGNOSIS — N320 Bladder-neck obstruction: Secondary | ICD-10-CM | POA: Diagnosis not present

## 2015-09-05 DIAGNOSIS — Z7902 Long term (current) use of antithrombotics/antiplatelets: Secondary | ICD-10-CM | POA: Diagnosis not present

## 2015-09-05 DIAGNOSIS — R6882 Decreased libido: Secondary | ICD-10-CM | POA: Diagnosis not present

## 2015-09-05 DIAGNOSIS — E1140 Type 2 diabetes mellitus with diabetic neuropathy, unspecified: Secondary | ICD-10-CM | POA: Diagnosis not present

## 2015-09-05 DIAGNOSIS — Z91048 Other nonmedicinal substance allergy status: Secondary | ICD-10-CM | POA: Diagnosis not present

## 2015-09-05 DIAGNOSIS — I252 Old myocardial infarction: Secondary | ICD-10-CM | POA: Diagnosis not present

## 2015-09-05 DIAGNOSIS — H02423 Myogenic ptosis of bilateral eyelids: Secondary | ICD-10-CM | POA: Diagnosis not present

## 2015-09-10 ENCOUNTER — Ambulatory Visit: Payer: Medicare Other

## 2015-09-11 DIAGNOSIS — Z48810 Encounter for surgical aftercare following surgery on the sense organs: Secondary | ICD-10-CM | POA: Diagnosis not present

## 2015-09-11 DIAGNOSIS — Z7984 Long term (current) use of oral hypoglycemic drugs: Secondary | ICD-10-CM | POA: Diagnosis not present

## 2015-10-10 DIAGNOSIS — N35013 Post-traumatic anterior urethral stricture: Secondary | ICD-10-CM | POA: Diagnosis not present

## 2015-10-23 DIAGNOSIS — Z48810 Encounter for surgical aftercare following surgery on the sense organs: Secondary | ICD-10-CM | POA: Diagnosis not present

## 2015-10-23 DIAGNOSIS — Z9889 Other specified postprocedural states: Secondary | ICD-10-CM | POA: Diagnosis not present

## 2015-10-23 DIAGNOSIS — Z79899 Other long term (current) drug therapy: Secondary | ICD-10-CM | POA: Diagnosis not present

## 2015-10-23 DIAGNOSIS — Z8669 Personal history of other diseases of the nervous system and sense organs: Secondary | ICD-10-CM | POA: Diagnosis not present

## 2015-10-23 DIAGNOSIS — E119 Type 2 diabetes mellitus without complications: Secondary | ICD-10-CM | POA: Diagnosis not present

## 2015-10-23 DIAGNOSIS — Z885 Allergy status to narcotic agent status: Secondary | ICD-10-CM | POA: Diagnosis not present

## 2015-10-23 DIAGNOSIS — Z7984 Long term (current) use of oral hypoglycemic drugs: Secondary | ICD-10-CM | POA: Diagnosis not present

## 2015-10-28 ENCOUNTER — Other Ambulatory Visit: Payer: Self-pay | Admitting: *Deleted

## 2015-10-28 MED ORDER — NITROGLYCERIN 0.4 MG SL SUBL: SUBLINGUAL_TABLET | SUBLINGUAL | Status: DC

## 2015-10-30 DIAGNOSIS — I10 Essential (primary) hypertension: Secondary | ICD-10-CM | POA: Diagnosis not present

## 2015-10-30 DIAGNOSIS — F419 Anxiety disorder, unspecified: Secondary | ICD-10-CM | POA: Diagnosis not present

## 2015-10-30 DIAGNOSIS — Z7984 Long term (current) use of oral hypoglycemic drugs: Secondary | ICD-10-CM | POA: Diagnosis not present

## 2015-10-30 DIAGNOSIS — E119 Type 2 diabetes mellitus without complications: Secondary | ICD-10-CM | POA: Diagnosis not present

## 2015-10-30 DIAGNOSIS — G479 Sleep disorder, unspecified: Secondary | ICD-10-CM | POA: Diagnosis not present

## 2015-10-30 DIAGNOSIS — E782 Mixed hyperlipidemia: Secondary | ICD-10-CM | POA: Diagnosis not present

## 2015-10-30 DIAGNOSIS — Z0000 Encounter for general adult medical examination without abnormal findings: Secondary | ICD-10-CM | POA: Diagnosis not present

## 2015-10-30 DIAGNOSIS — I2510 Atherosclerotic heart disease of native coronary artery without angina pectoris: Secondary | ICD-10-CM | POA: Diagnosis not present

## 2015-10-31 DIAGNOSIS — E119 Type 2 diabetes mellitus without complications: Secondary | ICD-10-CM | POA: Diagnosis not present

## 2015-11-14 DIAGNOSIS — R05 Cough: Secondary | ICD-10-CM | POA: Diagnosis not present

## 2016-01-14 DIAGNOSIS — L570 Actinic keratosis: Secondary | ICD-10-CM | POA: Diagnosis not present

## 2016-01-14 DIAGNOSIS — L821 Other seborrheic keratosis: Secondary | ICD-10-CM | POA: Diagnosis not present

## 2016-01-14 DIAGNOSIS — K146 Glossodynia: Secondary | ICD-10-CM | POA: Diagnosis not present

## 2016-01-14 DIAGNOSIS — L738 Other specified follicular disorders: Secondary | ICD-10-CM | POA: Diagnosis not present

## 2016-01-14 DIAGNOSIS — L814 Other melanin hyperpigmentation: Secondary | ICD-10-CM | POA: Diagnosis not present

## 2016-01-19 DIAGNOSIS — N35013 Post-traumatic anterior urethral stricture: Secondary | ICD-10-CM | POA: Diagnosis not present

## 2016-01-19 DIAGNOSIS — R3911 Hesitancy of micturition: Secondary | ICD-10-CM | POA: Diagnosis not present

## 2016-01-19 DIAGNOSIS — N411 Chronic prostatitis: Secondary | ICD-10-CM | POA: Diagnosis not present

## 2016-02-12 ENCOUNTER — Ambulatory Visit (INDEPENDENT_AMBULATORY_CARE_PROVIDER_SITE_OTHER): Payer: Medicare Other | Admitting: Podiatry

## 2016-02-12 ENCOUNTER — Ambulatory Visit (INDEPENDENT_AMBULATORY_CARE_PROVIDER_SITE_OTHER): Payer: Medicare Other

## 2016-02-12 ENCOUNTER — Encounter: Payer: Self-pay | Admitting: Podiatry

## 2016-02-12 VITALS — BP 111/59 | HR 67 | Resp 16 | Ht 67.5 in | Wt 153.0 lb

## 2016-02-12 DIAGNOSIS — M79672 Pain in left foot: Secondary | ICD-10-CM

## 2016-02-12 DIAGNOSIS — M7662 Achilles tendinitis, left leg: Secondary | ICD-10-CM

## 2016-02-12 MED ORDER — TRIAMCINOLONE ACETONIDE 10 MG/ML IJ SUSP
10.0000 mg | Freq: Once | INTRAMUSCULAR | Status: AC
  Administered 2016-02-12: 10 mg

## 2016-02-12 MED ORDER — MELOXICAM 15 MG PO TABS: 15.0000 mg | ORAL_TABLET | Freq: Every day | ORAL | 1 refills | Status: DC

## 2016-02-12 NOTE — Progress Notes (Signed)
   Subjective:    Patient ID: Jon Sanchez, male    DOB: 1940-10-03, 75 y.o.   MRN: [id number]  HPI Chief Complaint  Patient presents with  . Foot Pain    Left foot; back of heel; x6 months      Review of Systems  All other systems reviewed and are negative.      Objective:   Physical Exam        Assessment & Plan:

## 2016-02-12 NOTE — Patient Instructions (Signed)

## 2016-02-13 NOTE — Progress Notes (Signed)
Subjective:     Patient ID: Jon Sanchez, male   DOB: 04-Feb-1941, 75 y.o.   MRN: [id number]  HPI patient is developed a lot of pain in the back of the left heel and states that it's been going on now for several months and that he tries to be athletic but he cannot due to the discomfort he is experiencing. States it's intensified over the last 6 weeks   Review of Systems  All other systems reviewed and are negative.      Objective:   Physical Exam  Constitutional: He is oriented to person, place, and time.  Cardiovascular: Intact distal pulses.   Musculoskeletal: Normal range of motion.  Neurological: He is oriented to person, place, and time.  Skin: Skin is warm.  Nursing note and vitals reviewed.  neurovascular status found to be intact muscle strength adequate range of motion within normal limits with patient noted to have quite a bit of discomfort in the posterior aspect of the left heel lateral side with fluid buildup and pain when palpated. Patient does not have equinus or indications of Achilles tendon dysfunction and is found to have good digital perfusion and is well oriented 3     Assessment:     Inflammatory changes posterior left heel at the insertion into the calcaneus lateral side    Plan:     Achilles tendinitis left with inflammatory changes that at this time I went ahead and did H&P x-rays and then careful injection after explaining risk of rupture of the lateral area of the Achilles 3 mg dexamethasone Kenalog 5 mg Xylocaine and instructed on silicone anklet usage along with stretching exercises and shoe gear modification. Reappoint to recheck  X-ray report indicates that there is large spurring of the posterior aspect left heel with no indications of fracture

## 2016-03-04 ENCOUNTER — Ambulatory Visit: Payer: Medicare Other | Admitting: Podiatry

## 2016-03-10 ENCOUNTER — Ambulatory Visit: Payer: Medicare Other | Admitting: Podiatry

## 2016-03-31 ENCOUNTER — Encounter: Payer: Self-pay | Admitting: Interventional Cardiology

## 2016-04-13 ENCOUNTER — Ambulatory Visit (INDEPENDENT_AMBULATORY_CARE_PROVIDER_SITE_OTHER): Payer: Medicare Other | Admitting: Interventional Cardiology

## 2016-04-13 ENCOUNTER — Encounter: Payer: Self-pay | Admitting: Interventional Cardiology

## 2016-04-13 ENCOUNTER — Encounter (INDEPENDENT_AMBULATORY_CARE_PROVIDER_SITE_OTHER): Payer: Self-pay

## 2016-04-13 VITALS — BP 112/70 | HR 65 | Ht 67.0 in | Wt 155.0 lb

## 2016-04-13 DIAGNOSIS — I10 Essential (primary) hypertension: Secondary | ICD-10-CM | POA: Diagnosis not present

## 2016-04-13 DIAGNOSIS — I2510 Atherosclerotic heart disease of native coronary artery without angina pectoris: Secondary | ICD-10-CM

## 2016-04-13 DIAGNOSIS — E782 Mixed hyperlipidemia: Secondary | ICD-10-CM

## 2016-04-13 DIAGNOSIS — E1159 Type 2 diabetes mellitus with other circulatory complications: Secondary | ICD-10-CM

## 2016-04-13 DIAGNOSIS — I252 Old myocardial infarction: Secondary | ICD-10-CM | POA: Diagnosis not present

## 2016-04-13 NOTE — Progress Notes (Signed)
zPatient ID: BUSH BUEHRING, male   DOB: 10-Mar-1941, 75 y.o.   MRN: [id number]     Cardiology Office Note   Date:  04/13/2016   ID:  Joshualee, Estrela 1941/03/28, MRN [id number]  PCP:  Melinda Crutch, MD    No chief complaint on file. CAD   Wt Readings from Last 3 Encounters:  04/13/16 70.3 kg (155 lb)  02/12/16 69.4 kg (153 lb)  04/14/15 72.6 kg (160 lb)       History of Present Illness: Jon Sanchez is a 75 y.o. male  who has had a DES to the LAD in 5/07. He has hearing loss and tinnitus in the right ear. CAD/ASCVD:   He has resumed walking one half hour per day, daily.  His wife has been ill so he has been taking care of her.  He has been shovelling dirt recently without any problems. Denies : Diaphoresis. Dizziness. Dyspnea on exertion. Fatigue. Leg edema. Nitroglycerin.   No pain in legs with walking.  No sx like his prior MI.   He now is on Metformin.  He is taking nutritional classes for diet improvement. He has lost weight intentionally.  His HbA1C was 8 off of metformin.    His latest A1C was 6.9    Past Medical History:  Diagnosis Date  . Actinic keratoses   . Allergic rhinitis   . Anxiety   . Benign essential tremor   . CAD (coronary artery disease)   . Chest pain   . Diabetes ([place])   . GERD (gastroesophageal reflux disease)   . Hyperlipidemia   . Myocardial infarct   . Osteoarthrosis, unspecified whether generalized or localized, hand   . Pain in joint     Past Surgical History:  Procedure Laterality Date  . CORONARY ARTERY BYPASS GRAFT       Current Outpatient Prescriptions  Medication Sig Dispense Refill  . atorvastatin (LIPITOR) 20 MG tablet Take 20 mg by mouth daily.    . B Complex Vitamins (VITAMIN B COMPLEX PO) Take 1 tablet by mouth daily.    . clopidogrel (PLAVIX) 75 MG tablet TAKE 1 TABLET (75 MG TOTAL) BY MOUTH DAILY. 90 tablet 3  . LORazepam (ATIVAN) 0.5 MG tablet Take 0.5 mg by mouth every 4 (four) hours as needed for  anxiety.     Marland Kitchen losartan (COZAAR) 25 MG tablet TAKE 1 TABLET (25 MG TOTAL) BY MOUTH DAILY. 90 tablet 2  . meloxicam (MOBIC) 15 MG tablet Take 1 tablet (15 mg total) by mouth daily. 60 tablet 1  . metFORMIN (GLUCOPHAGE-XR) 500 MG 24 hr tablet Take 500 mg by mouth daily with breakfast.    . Multiple Vitamin (MULTI-VITAMINS) TABS Take 1 tablet by mouth daily.     . nitroGLYCERIN (NITROSTAT) 0.4 MG SL tablet PLACE 1 TABLET UNDER THE TONGUE EVERY 5 MINUTES AS NEEDED FOR CHEST PAIN 25 tablet 3  . nitroGLYCERIN (NITROSTAT) 0.4 MG SL tablet Place 0.4 mg under the tongue every 5 (five) minutes as needed for chest pain. 3 DOSE MAX    . Omega-3 Fatty Acids (FISH OIL PO) Take 1 tablet by mouth daily.    . pantoprazole (PROTONIX) 40 MG tablet Take 40 mg by mouth daily.     Marland Kitchen PROAIR HFA 108 (90 BASE) MCG/ACT inhaler Inhale 2 puffs into the lungs every 6 (six) hours as needed (WHEEZING).     Marland Kitchen propranolol ER (INDERAL LA) 60 MG 24 hr capsule Take 60 mg by mouth  daily.     No current facility-administered medications for this visit.     Allergies:   Codeine    Social History:  The patient  reports that he has never smoked. He has never used smokeless tobacco. He reports that he does not drink alcohol or use drugs.   Family History:  The patient's family history includes Colon cancer in his mother; Heart attack in his father; Hypertension in his mother; Lung cancer in his father.    ROS:  Please see the history of present illness.   Otherwise, review of systems are positive for gradual hearing loss.   All other systems are reviewed and negative.    PHYSICAL EXAM: VS:  BP 112/70   Pulse 65   Ht 5\' 7"  (1.702 m)   Wt 70.3 kg (155 lb)   BMI 24.28 kg/m  , BMI Body mass index is 24.28 kg/m. GEN: Well nourished, well developed, in no acute distress  HEENT: normal  Neck: no JVD, carotid bruits, or masses Cardiac: RRR; no murmurs, rubs, or gallops,no edema  Respiratory:  clear to auscultation bilaterally,  normal work of breathing GI: soft, nontender, nondistended, + BS MS: no deformity or atrophy  Skin: warm and dry, no rash Neuro:  Strength and sensation are intact Psych: euthymic mood, full affect   EKG:   The ekg ordered today demonstrates NSR, no ECG changes   Recent Labs: 09/01/2015: ALT 16   Lipid Panel    Component Value Date/Time   CHOL 90 (L) 09/01/2015 0845   CHOL 99 07/27/2013 0907   TRIG 112 09/01/2015 0845   TRIG 102 07/27/2013 0907   HDL 28 (L) 09/01/2015 0845   HDL 35 (L) 07/27/2013 0907   CHOLHDL 3.2 09/01/2015 0845   VLDL 22 09/01/2015 0845   LDLCALC 40 09/01/2015 0845   LDLCALC 44 07/27/2013 0907     Other studies Reviewed: Additional studies/ records that were reviewed today with results demonstrating: lipid results from 2/16.   ASSESSMENT AND PLAN:  1. CAD: Continue clopidogrel 75 mg daily.  NTG prn.  No angina.First generation DES in critical aea of his heart. WOuld continue plavix. He has not had significant bruising, no recent falls. Stopped aspirin. Normal stress test in 2008. No symptoms like what he had prior to his stent. COntinue regular exercise.   2. Mixed hyperlipidemia:  Controlled in 3/17.  Recheck lipids in April 2018.   3. Old MI: No CHF.  Walks regularly.  4. Diabetes mellitus: On metformin. Managed by PMD.  A1C better controlled.  5. Hypertension: BP well controlled. Continue ARB given DM. Continue low salt diet as well.    Current medicines are reviewed at length with the patient today.  The patient concerns regarding his medicines were addressed.  The following changes have been made:  No change  Labs/ tests ordered today include:  No orders of the defined types were placed in this encounter.   Recommend 150 minutes/week of aerobic exercise Low fat, low carb, high fiber diet recommended  Disposition:   FU in 1 year   Signed, Larae Grooms, MD  04/13/2016 9:15 AM    [facility] [place], [place], [place]  [address] Phone: [phone]; Fax: [phone]

## 2016-04-13 NOTE — Patient Instructions (Signed)
**Note De-Identified Jon Sanchez Obfuscation** Medication Instructions:  Same-no changes  Labwork: In 6 months-Lipids and CMET.Please do not eat or drink after midnight the night before labs are drawn.  Testing/Procedures: None  Follow-Up: Your physician wants you to follow-up in: 1 year. You will receive a reminder letter in the mail two months in advance. If you don't receive a letter, please call our office to schedule the follow-up appointment.      If you need a refill on your cardiac medications before your next appointment, please call your pharmacy.

## 2016-04-19 DIAGNOSIS — Z23 Encounter for immunization: Secondary | ICD-10-CM | POA: Diagnosis not present

## 2016-04-19 DIAGNOSIS — M545 Low back pain: Secondary | ICD-10-CM | POA: Diagnosis not present

## 2016-04-30 DIAGNOSIS — N35013 Post-traumatic anterior urethral stricture: Secondary | ICD-10-CM | POA: Diagnosis not present

## 2016-04-30 DIAGNOSIS — N35014 Post-traumatic urethral stricture, male, unspecified: Secondary | ICD-10-CM | POA: Insufficient documentation

## 2016-04-30 DIAGNOSIS — N411 Chronic prostatitis: Secondary | ICD-10-CM | POA: Diagnosis not present

## 2016-04-30 DIAGNOSIS — N320 Bladder-neck obstruction: Secondary | ICD-10-CM | POA: Diagnosis not present

## 2016-05-03 DIAGNOSIS — L570 Actinic keratosis: Secondary | ICD-10-CM | POA: Diagnosis not present

## 2016-05-03 DIAGNOSIS — E119 Type 2 diabetes mellitus without complications: Secondary | ICD-10-CM | POA: Diagnosis not present

## 2016-05-03 DIAGNOSIS — F419 Anxiety disorder, unspecified: Secondary | ICD-10-CM | POA: Diagnosis not present

## 2016-05-21 DIAGNOSIS — S7001XA Contusion of right hip, initial encounter: Secondary | ICD-10-CM | POA: Diagnosis not present

## 2016-05-29 ENCOUNTER — Other Ambulatory Visit: Payer: Self-pay | Admitting: Interventional Cardiology

## 2016-06-02 ENCOUNTER — Other Ambulatory Visit: Payer: Self-pay | Admitting: Interventional Cardiology

## 2016-07-02 ENCOUNTER — Other Ambulatory Visit: Payer: Self-pay | Admitting: *Deleted

## 2016-07-02 MED ORDER — LOSARTAN POTASSIUM 25 MG PO TABS: ORAL_TABLET | ORAL | 2 refills | Status: DC

## 2016-07-05 DIAGNOSIS — M722 Plantar fascial fibromatosis: Secondary | ICD-10-CM | POA: Diagnosis not present

## 2016-07-08 ENCOUNTER — Other Ambulatory Visit: Payer: Self-pay | Admitting: Podiatry

## 2016-07-09 NOTE — Telephone Encounter (Signed)
Pt needs an appt prior to future refills. 

## 2016-07-25 IMAGING — MR MR HEAD WO/W CM
7 of 11 series · 27 of 48 positions shown · IV contrast (multihance)
Comparison: None.

CLINICAL DATA: RIGHT ear hearing loss, asymmetric. Evaluate for
retrocochlear lesion.

EXAM:
MRI HEAD WITHOUT AND WITH CONTRAST
TECHNIQUE: Multiplanar, multiecho pulse sequences of the brain and surrounding
structures were obtained without and with intravenous contrast.
CONTRAST:  15mL MULTIHANCE GADOBENATE DIMEGLUMINE 529 MG/ML IV SOLN
Creatinine was obtained on site at [HOSPITAL] at [HOSPITAL].
Results: Creatinine 0.8 mg/dL.

[Series 2: T1 · sagittal · 5.0mm · 0.45mm/px · 3 of 19 slices shown]
[im 1/19]
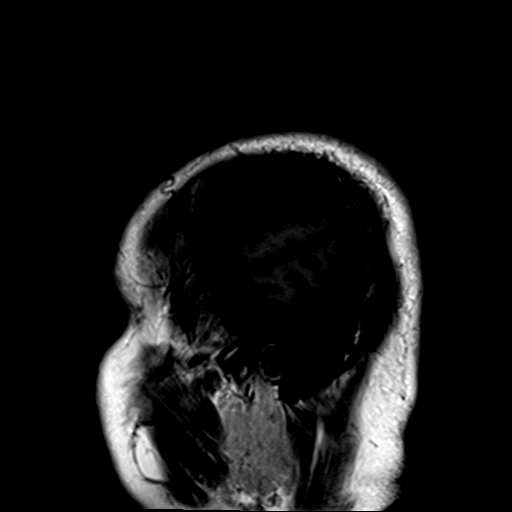
[im 10/19]
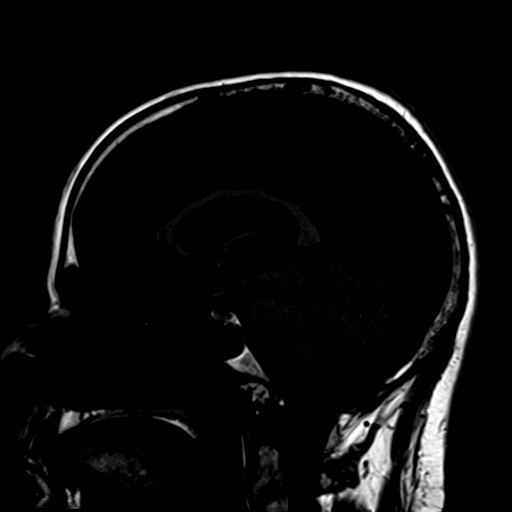
[im 19/19]
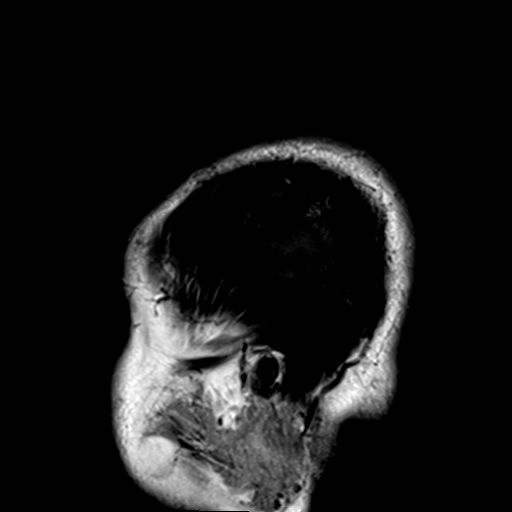

[Series 3: DWI · axial · 3.0mm · 1.80mm/px · z∈[-68,+78]mm · 8 of 100 slices shown (1 of 2)]
[im 1/100]
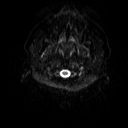
[im 16/100]
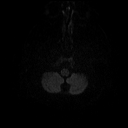
[im 31/100]
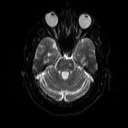
[im 46/100]
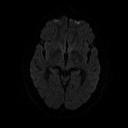
[im 54/100]
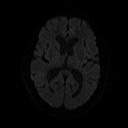
[im 69/100]
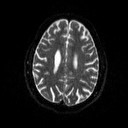
[im 84/100]
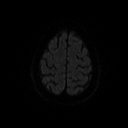
[im 100/100]
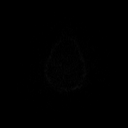

[Series 4: DWI · axial · 3.0mm · 1.80mm/px · z∈[-68,+78]mm · 6 of 48 slices shown (2 of 2)]
[im 1/48]
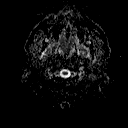
[im 10/48]
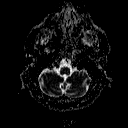
[im 19/48]
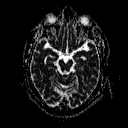
[im 29/48]
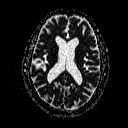
[im 38/48]
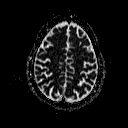
[im 48/48]
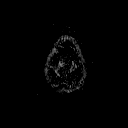

[Series 5: T2 · axial · 5.0mm · 0.36mm/px · z∈[-65,+68]mm · 3 of 20 slices shown]
[im 1/20]
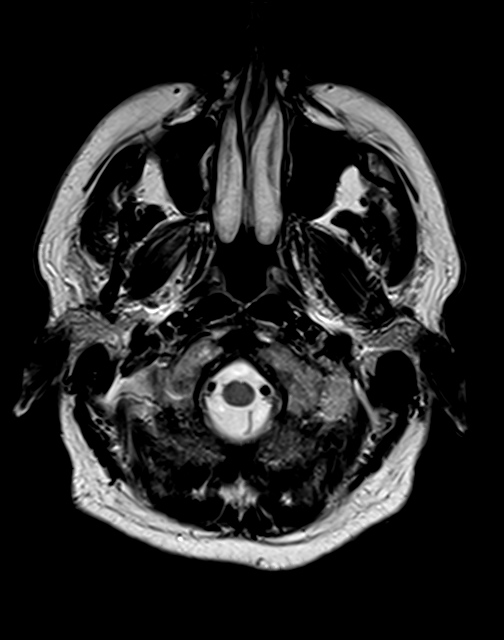
[im 10/20]
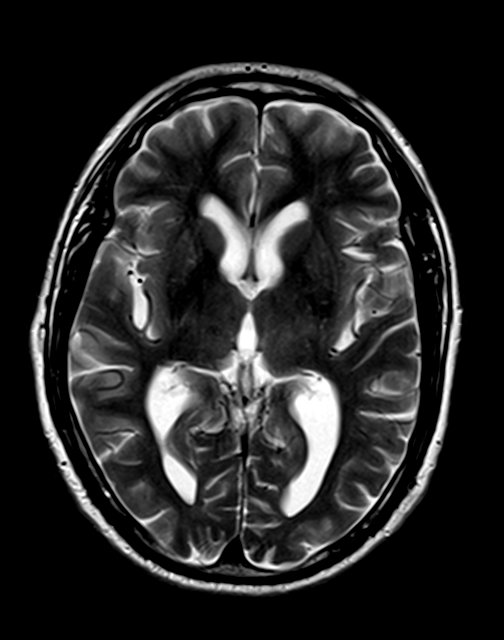
[im 20/20]
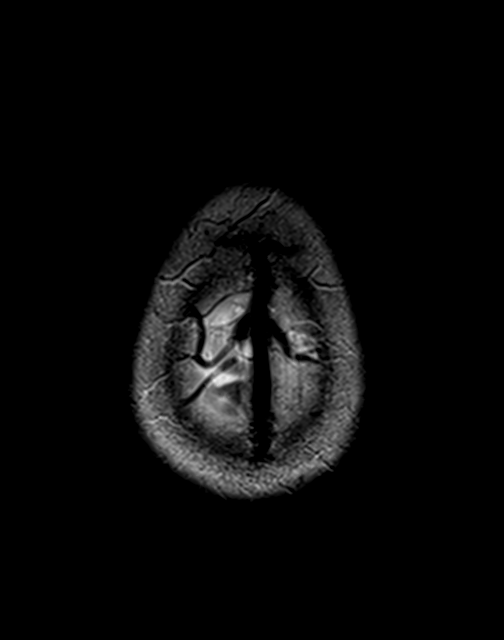

[Series 6: cor 3mm · coronal · 3.0mm · 0.35mm/px · 1 of 11 slices shown]
[im 1/11]
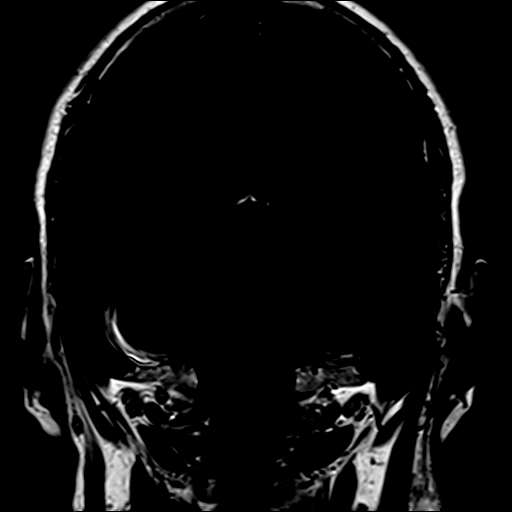

[Series 7: FLAIR · axial · 5.0mm · 0.45mm/px · z∈[-65,+68]mm · 3 of 20 slices shown]
[im 1/20]
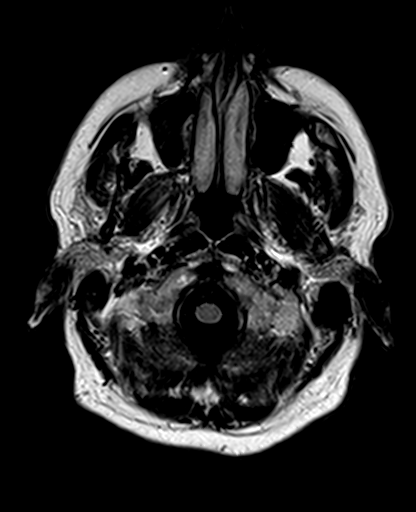
[im 10/20]
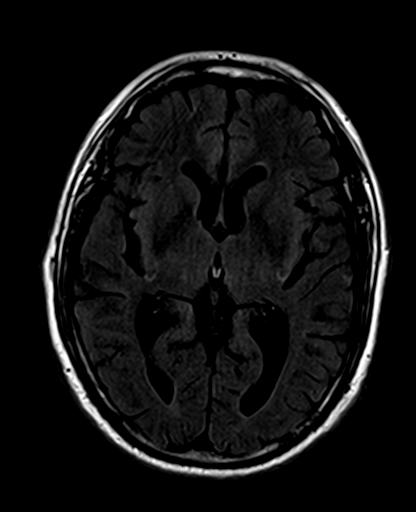
[im 20/20]
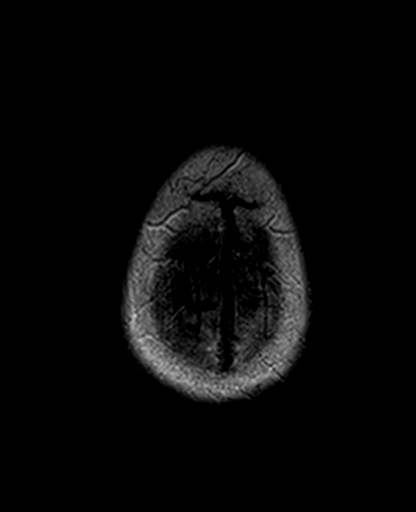

[Series 8: bSSFP · axial · 1.0mm · 0.23mm/px · z∈[-58,-39]mm · 3 of 40 slices shown]
[im 1/40]
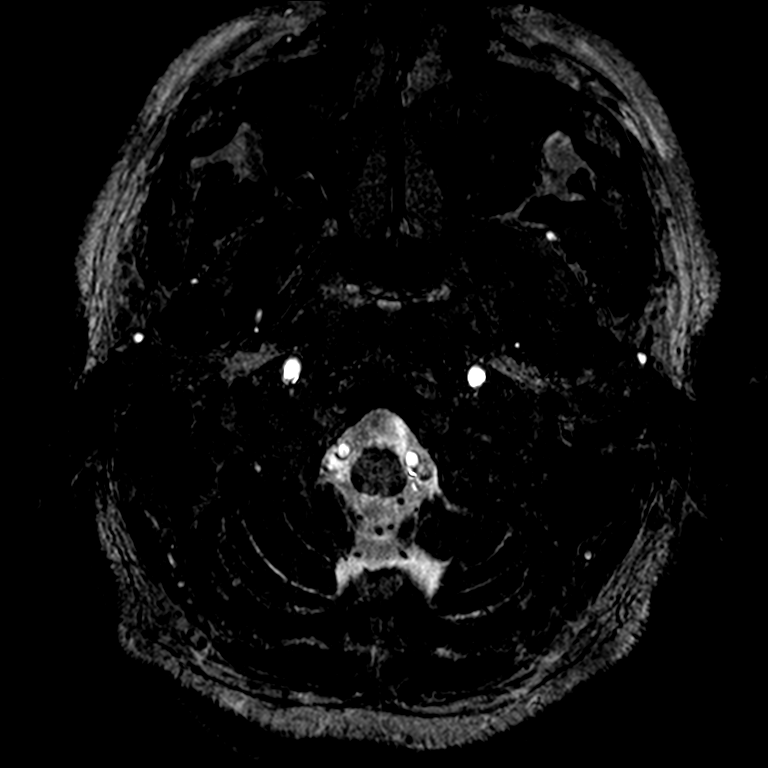
[im 10/40]
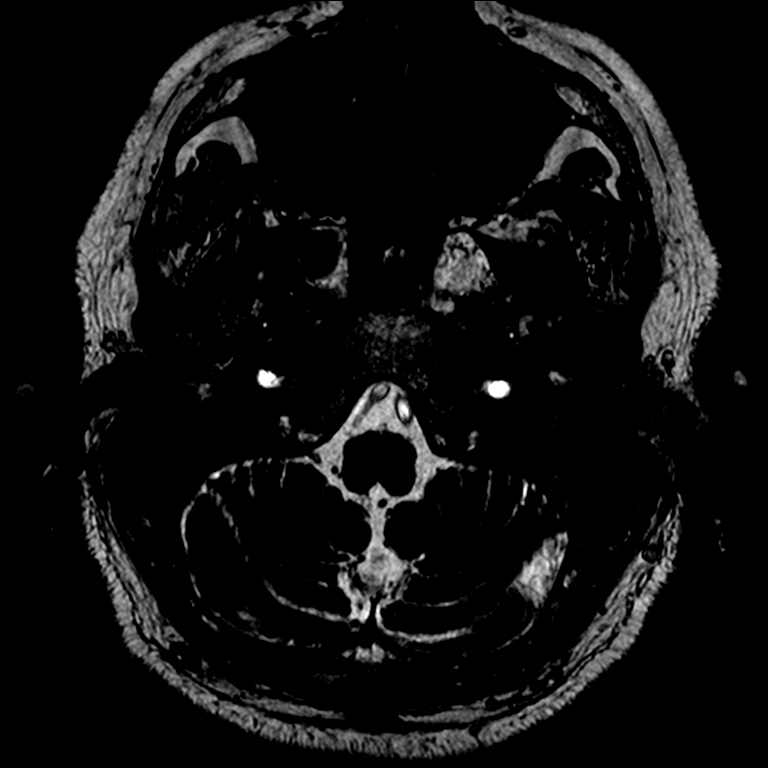
[im 20/40]
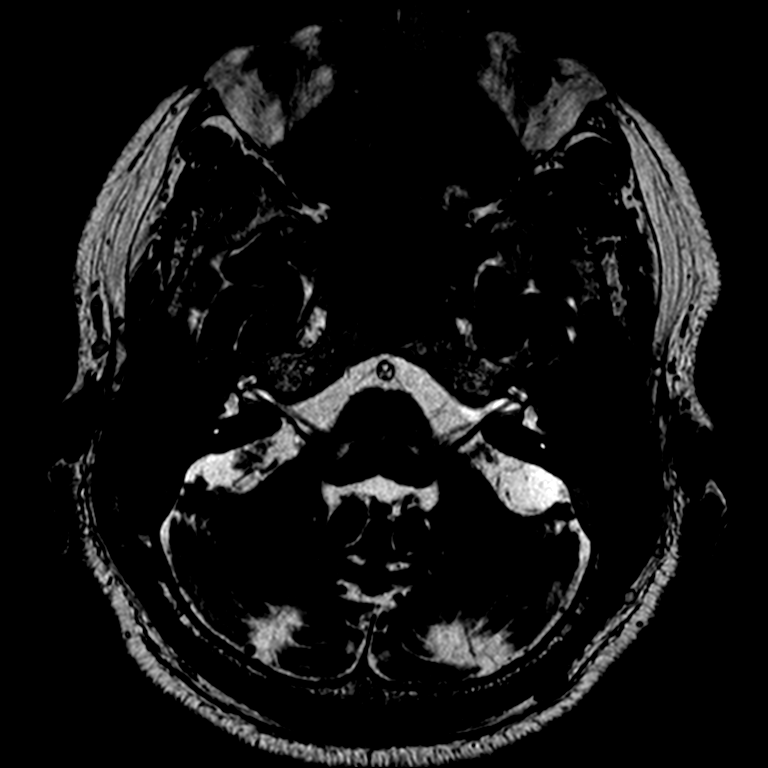

[27 of 48 positions shown; findings below may reference images not displayed]

FINDINGS: No evidence for acute infarction, hemorrhage, mass lesion,
hydrocephalus, or extra-axial fluid.

Normal for age cerebral volume. No significant supratentorial white
matter disease. Small foci of T2 and FLAIR hyperintensity in the mid
pons likely representing atypical chronic microvascular ischemic
change related to hypertension and/or diabetes.

Post infusion, no abnormal enhancement of the brain or meninges.

Thin-section imaging through the posterior fossa demonstrates no
evidence for vestibular schwannoma or posterior fossa mass. No
temporal bone inflammatory process. Major dural venous sinuses are
patent.

Flow voids are preserved. No midline abnormality. No sinus disease
of significance. Negative orbits.
IMPRESSION: Small foci of T2 and FLAIR hyperintensity in the mid pons, likely
representing atypical chronic microvascular ischemic change related
to hypertension and/or diabetes.

No acute intracranial findings.

No retrocochlear lesion is evident.

## 2016-08-18 DIAGNOSIS — L570 Actinic keratosis: Secondary | ICD-10-CM | POA: Diagnosis not present

## 2016-08-18 DIAGNOSIS — Z885 Allergy status to narcotic agent status: Secondary | ICD-10-CM | POA: Diagnosis not present

## 2016-08-18 DIAGNOSIS — D489 Neoplasm of uncertain behavior, unspecified: Secondary | ICD-10-CM | POA: Diagnosis not present

## 2016-08-18 DIAGNOSIS — D2261 Melanocytic nevi of right upper limb, including shoulder: Secondary | ICD-10-CM | POA: Diagnosis not present

## 2016-08-18 DIAGNOSIS — Z85828 Personal history of other malignant neoplasm of skin: Secondary | ICD-10-CM | POA: Diagnosis not present

## 2016-08-18 DIAGNOSIS — E119 Type 2 diabetes mellitus without complications: Secondary | ICD-10-CM | POA: Diagnosis not present

## 2016-08-18 DIAGNOSIS — Z9889 Other specified postprocedural states: Secondary | ICD-10-CM | POA: Diagnosis not present

## 2016-08-18 DIAGNOSIS — L821 Other seborrheic keratosis: Secondary | ICD-10-CM | POA: Diagnosis not present

## 2016-08-18 DIAGNOSIS — L814 Other melanin hyperpigmentation: Secondary | ICD-10-CM | POA: Diagnosis not present

## 2016-08-19 ENCOUNTER — Ambulatory Visit: Payer: Medicare Other | Admitting: Podiatry

## 2016-10-06 DIAGNOSIS — M79672 Pain in left foot: Secondary | ICD-10-CM | POA: Diagnosis not present

## 2016-10-06 DIAGNOSIS — M7662 Achilles tendinitis, left leg: Secondary | ICD-10-CM | POA: Diagnosis not present

## 2016-10-08 DIAGNOSIS — S86012A Strain of left Achilles tendon, initial encounter: Secondary | ICD-10-CM | POA: Diagnosis not present

## 2016-10-11 ENCOUNTER — Other Ambulatory Visit: Payer: Medicare Other

## 2016-10-11 ENCOUNTER — Other Ambulatory Visit: Payer: Self-pay | Admitting: *Deleted

## 2016-10-11 DIAGNOSIS — Z79899 Other long term (current) drug therapy: Secondary | ICD-10-CM | POA: Diagnosis not present

## 2016-10-11 DIAGNOSIS — E782 Mixed hyperlipidemia: Secondary | ICD-10-CM

## 2016-10-12 LAB — COMPREHENSIVE METABOLIC PANEL
ALT: 15 IU/L (ref 0–44)
AST: 14 IU/L (ref 0–40)
Albumin/Globulin Ratio: 2.5 — ABNORMAL HIGH (ref 1.2–2.2)
Albumin: 4.3 g/dL (ref 3.5–4.8)
Alkaline Phosphatase: 78 IU/L (ref 39–117)
BILIRUBIN TOTAL: 1.5 mg/dL — AB (ref 0.0–1.2)
BUN/Creatinine Ratio: 19 (ref 10–24)
BUN: 14 mg/dL (ref 8–27)
CHLORIDE: 100 mmol/L (ref 96–106)
CO2: 28 mmol/L (ref 18–29)
Calcium: 9.1 mg/dL (ref 8.6–10.2)
Creatinine, Ser: 0.74 mg/dL — ABNORMAL LOW (ref 0.76–1.27)
GFR calc Af Amer: 105 mL/min/{1.73_m2} (ref >59)
GFR calc non Af Amer: 91 mL/min/{1.73_m2} (ref >59)
GLOBULIN, TOTAL: 1.7 g/dL (ref 1.5–4.5)
Glucose: 178 mg/dL — ABNORMAL HIGH (ref 65–99)
Potassium: 4.8 mmol/L (ref 3.5–5.2)
SODIUM: 142 mmol/L (ref 134–144)
Total Protein: 6 g/dL (ref 6.0–8.5)

## 2016-10-12 LAB — LIPID PANEL
CHOL/HDL RATIO: 3.4 ratio (ref 0.0–5.0)
CHOLESTEROL TOTAL: 106 mg/dL (ref 100–199)
HDL: 31 mg/dL — AB (ref >39)
LDL CALC: 43 mg/dL (ref 0–99)
Triglycerides: 159 mg/dL — ABNORMAL HIGH (ref 0–149)
VLDL CHOLESTEROL CAL: 32 mg/dL (ref 5–40)

## 2016-10-18 DIAGNOSIS — E119 Type 2 diabetes mellitus without complications: Secondary | ICD-10-CM | POA: Diagnosis not present

## 2016-10-18 DIAGNOSIS — N411 Chronic prostatitis: Secondary | ICD-10-CM | POA: Diagnosis not present

## 2016-10-18 DIAGNOSIS — E291 Testicular hypofunction: Secondary | ICD-10-CM | POA: Diagnosis not present

## 2016-10-18 DIAGNOSIS — N320 Bladder-neck obstruction: Secondary | ICD-10-CM | POA: Diagnosis not present

## 2016-10-18 DIAGNOSIS — N35013 Post-traumatic anterior urethral stricture: Secondary | ICD-10-CM | POA: Diagnosis not present

## 2016-11-05 DIAGNOSIS — E119 Type 2 diabetes mellitus without complications: Secondary | ICD-10-CM | POA: Diagnosis not present

## 2016-11-09 DIAGNOSIS — M7662 Achilles tendinitis, left leg: Secondary | ICD-10-CM | POA: Diagnosis not present

## 2016-11-16 DIAGNOSIS — I10 Essential (primary) hypertension: Secondary | ICD-10-CM | POA: Diagnosis not present

## 2016-11-16 DIAGNOSIS — E782 Mixed hyperlipidemia: Secondary | ICD-10-CM | POA: Diagnosis not present

## 2016-11-16 DIAGNOSIS — E119 Type 2 diabetes mellitus without complications: Secondary | ICD-10-CM | POA: Diagnosis not present

## 2016-11-16 DIAGNOSIS — N529 Male erectile dysfunction, unspecified: Secondary | ICD-10-CM | POA: Diagnosis not present

## 2016-11-16 DIAGNOSIS — F419 Anxiety disorder, unspecified: Secondary | ICD-10-CM | POA: Diagnosis not present

## 2016-11-16 DIAGNOSIS — Z0000 Encounter for general adult medical examination without abnormal findings: Secondary | ICD-10-CM | POA: Diagnosis not present

## 2016-11-30 DIAGNOSIS — S86012A Strain of left Achilles tendon, initial encounter: Secondary | ICD-10-CM | POA: Diagnosis not present

## 2016-12-02 DIAGNOSIS — S86012A Strain of left Achilles tendon, initial encounter: Secondary | ICD-10-CM | POA: Diagnosis not present

## 2016-12-06 DIAGNOSIS — S86112A Strain of other muscle(s) and tendon(s) of posterior muscle group at lower leg level, left leg, initial encounter: Secondary | ICD-10-CM | POA: Diagnosis not present

## 2016-12-06 DIAGNOSIS — S86812A Strain of other muscle(s) and tendon(s) at lower leg level, left leg, initial encounter: Secondary | ICD-10-CM | POA: Diagnosis not present

## 2016-12-29 DIAGNOSIS — M25572 Pain in left ankle and joints of left foot: Secondary | ICD-10-CM | POA: Diagnosis not present

## 2016-12-29 DIAGNOSIS — Z7409 Other reduced mobility: Secondary | ICD-10-CM | POA: Diagnosis not present

## 2016-12-29 DIAGNOSIS — S86112A Strain of other muscle(s) and tendon(s) of posterior muscle group at lower leg level, left leg, initial encounter: Secondary | ICD-10-CM | POA: Diagnosis not present

## 2016-12-31 DIAGNOSIS — Z7409 Other reduced mobility: Secondary | ICD-10-CM | POA: Diagnosis not present

## 2016-12-31 DIAGNOSIS — M25572 Pain in left ankle and joints of left foot: Secondary | ICD-10-CM | POA: Diagnosis not present

## 2016-12-31 DIAGNOSIS — S86112A Strain of other muscle(s) and tendon(s) of posterior muscle group at lower leg level, left leg, initial encounter: Secondary | ICD-10-CM | POA: Diagnosis not present

## 2017-01-03 DIAGNOSIS — S86112D Strain of other muscle(s) and tendon(s) of posterior muscle group at lower leg level, left leg, subsequent encounter: Secondary | ICD-10-CM | POA: Diagnosis not present

## 2017-01-03 DIAGNOSIS — M7662 Achilles tendinitis, left leg: Secondary | ICD-10-CM | POA: Diagnosis not present

## 2017-01-04 DIAGNOSIS — S86112A Strain of other muscle(s) and tendon(s) of posterior muscle group at lower leg level, left leg, initial encounter: Secondary | ICD-10-CM | POA: Diagnosis not present

## 2017-01-04 DIAGNOSIS — Z7409 Other reduced mobility: Secondary | ICD-10-CM | POA: Diagnosis not present

## 2017-01-04 DIAGNOSIS — M25572 Pain in left ankle and joints of left foot: Secondary | ICD-10-CM | POA: Diagnosis not present

## 2017-01-11 DIAGNOSIS — S86112A Strain of other muscle(s) and tendon(s) of posterior muscle group at lower leg level, left leg, initial encounter: Secondary | ICD-10-CM | POA: Diagnosis not present

## 2017-01-11 DIAGNOSIS — Z7409 Other reduced mobility: Secondary | ICD-10-CM | POA: Diagnosis not present

## 2017-01-11 DIAGNOSIS — M25572 Pain in left ankle and joints of left foot: Secondary | ICD-10-CM | POA: Diagnosis not present

## 2017-01-14 DIAGNOSIS — S86112A Strain of other muscle(s) and tendon(s) of posterior muscle group at lower leg level, left leg, initial encounter: Secondary | ICD-10-CM | POA: Diagnosis not present

## 2017-01-14 DIAGNOSIS — Z7409 Other reduced mobility: Secondary | ICD-10-CM | POA: Diagnosis not present

## 2017-01-14 DIAGNOSIS — M25572 Pain in left ankle and joints of left foot: Secondary | ICD-10-CM | POA: Diagnosis not present

## 2017-01-19 DIAGNOSIS — Z7409 Other reduced mobility: Secondary | ICD-10-CM | POA: Diagnosis not present

## 2017-01-19 DIAGNOSIS — M6281 Muscle weakness (generalized): Secondary | ICD-10-CM | POA: Diagnosis not present

## 2017-01-19 DIAGNOSIS — G8929 Other chronic pain: Secondary | ICD-10-CM | POA: Diagnosis not present

## 2017-01-19 DIAGNOSIS — R2689 Other abnormalities of gait and mobility: Secondary | ICD-10-CM | POA: Diagnosis not present

## 2017-01-19 DIAGNOSIS — M25572 Pain in left ankle and joints of left foot: Secondary | ICD-10-CM | POA: Diagnosis not present

## 2017-01-19 DIAGNOSIS — M25672 Stiffness of left ankle, not elsewhere classified: Secondary | ICD-10-CM | POA: Diagnosis not present

## 2017-01-19 DIAGNOSIS — S86112A Strain of other muscle(s) and tendon(s) of posterior muscle group at lower leg level, left leg, initial encounter: Secondary | ICD-10-CM | POA: Diagnosis not present

## 2017-01-21 DIAGNOSIS — M6281 Muscle weakness (generalized): Secondary | ICD-10-CM | POA: Diagnosis not present

## 2017-01-21 DIAGNOSIS — G8929 Other chronic pain: Secondary | ICD-10-CM | POA: Diagnosis not present

## 2017-01-21 DIAGNOSIS — Z7409 Other reduced mobility: Secondary | ICD-10-CM | POA: Diagnosis not present

## 2017-01-21 DIAGNOSIS — S86112A Strain of other muscle(s) and tendon(s) of posterior muscle group at lower leg level, left leg, initial encounter: Secondary | ICD-10-CM | POA: Diagnosis not present

## 2017-01-21 DIAGNOSIS — M25672 Stiffness of left ankle, not elsewhere classified: Secondary | ICD-10-CM | POA: Diagnosis not present

## 2017-01-21 DIAGNOSIS — M25572 Pain in left ankle and joints of left foot: Secondary | ICD-10-CM | POA: Diagnosis not present

## 2017-01-28 DIAGNOSIS — S86112A Strain of other muscle(s) and tendon(s) of posterior muscle group at lower leg level, left leg, initial encounter: Secondary | ICD-10-CM | POA: Diagnosis not present

## 2017-01-28 DIAGNOSIS — M25672 Stiffness of left ankle, not elsewhere classified: Secondary | ICD-10-CM | POA: Diagnosis not present

## 2017-01-28 DIAGNOSIS — G8929 Other chronic pain: Secondary | ICD-10-CM | POA: Diagnosis not present

## 2017-01-28 DIAGNOSIS — Z7409 Other reduced mobility: Secondary | ICD-10-CM | POA: Diagnosis not present

## 2017-01-28 DIAGNOSIS — M25572 Pain in left ankle and joints of left foot: Secondary | ICD-10-CM | POA: Diagnosis not present

## 2017-01-28 DIAGNOSIS — M6281 Muscle weakness (generalized): Secondary | ICD-10-CM | POA: Diagnosis not present

## 2017-02-03 DIAGNOSIS — M6281 Muscle weakness (generalized): Secondary | ICD-10-CM | POA: Diagnosis not present

## 2017-02-03 DIAGNOSIS — G8929 Other chronic pain: Secondary | ICD-10-CM | POA: Diagnosis not present

## 2017-02-03 DIAGNOSIS — Z7409 Other reduced mobility: Secondary | ICD-10-CM | POA: Diagnosis not present

## 2017-02-03 DIAGNOSIS — S86112A Strain of other muscle(s) and tendon(s) of posterior muscle group at lower leg level, left leg, initial encounter: Secondary | ICD-10-CM | POA: Diagnosis not present

## 2017-02-03 DIAGNOSIS — M25672 Stiffness of left ankle, not elsewhere classified: Secondary | ICD-10-CM | POA: Diagnosis not present

## 2017-02-03 DIAGNOSIS — M25572 Pain in left ankle and joints of left foot: Secondary | ICD-10-CM | POA: Diagnosis not present

## 2017-02-08 ENCOUNTER — Other Ambulatory Visit: Payer: Self-pay | Admitting: Interventional Cardiology

## 2017-02-10 DIAGNOSIS — M25672 Stiffness of left ankle, not elsewhere classified: Secondary | ICD-10-CM | POA: Diagnosis not present

## 2017-02-10 DIAGNOSIS — M25572 Pain in left ankle and joints of left foot: Secondary | ICD-10-CM | POA: Diagnosis not present

## 2017-02-10 DIAGNOSIS — S86112A Strain of other muscle(s) and tendon(s) of posterior muscle group at lower leg level, left leg, initial encounter: Secondary | ICD-10-CM | POA: Diagnosis not present

## 2017-02-10 DIAGNOSIS — Z7409 Other reduced mobility: Secondary | ICD-10-CM | POA: Diagnosis not present

## 2017-02-10 DIAGNOSIS — M6281 Muscle weakness (generalized): Secondary | ICD-10-CM | POA: Diagnosis not present

## 2017-02-10 DIAGNOSIS — G8929 Other chronic pain: Secondary | ICD-10-CM | POA: Diagnosis not present

## 2017-02-14 DIAGNOSIS — S86012A Strain of left Achilles tendon, initial encounter: Secondary | ICD-10-CM | POA: Diagnosis not present

## 2017-02-24 DIAGNOSIS — Z23 Encounter for immunization: Secondary | ICD-10-CM | POA: Diagnosis not present

## 2017-02-24 DIAGNOSIS — S86012A Strain of left Achilles tendon, initial encounter: Secondary | ICD-10-CM | POA: Diagnosis not present

## 2017-02-24 DIAGNOSIS — J329 Chronic sinusitis, unspecified: Secondary | ICD-10-CM | POA: Diagnosis not present

## 2017-02-28 DIAGNOSIS — M7662 Achilles tendinitis, left leg: Secondary | ICD-10-CM | POA: Diagnosis not present

## 2017-02-28 DIAGNOSIS — M79671 Pain in right foot: Secondary | ICD-10-CM | POA: Diagnosis not present

## 2017-02-28 DIAGNOSIS — M25571 Pain in right ankle and joints of right foot: Secondary | ICD-10-CM | POA: Diagnosis not present

## 2017-02-28 DIAGNOSIS — M7752 Other enthesopathy of left foot: Secondary | ICD-10-CM | POA: Diagnosis not present

## 2017-02-28 DIAGNOSIS — M7732 Calcaneal spur, left foot: Secondary | ICD-10-CM | POA: Diagnosis not present

## 2017-02-28 DIAGNOSIS — S86012A Strain of left Achilles tendon, initial encounter: Secondary | ICD-10-CM | POA: Diagnosis not present

## 2017-02-28 DIAGNOSIS — M10071 Idiopathic gout, right ankle and foot: Secondary | ICD-10-CM | POA: Diagnosis not present

## 2017-03-03 DIAGNOSIS — Z01818 Encounter for other preprocedural examination: Secondary | ICD-10-CM | POA: Diagnosis not present

## 2017-03-06 DIAGNOSIS — M7662 Achilles tendinitis, left leg: Secondary | ICD-10-CM | POA: Diagnosis not present

## 2017-03-06 DIAGNOSIS — S86012A Strain of left Achilles tendon, initial encounter: Secondary | ICD-10-CM | POA: Diagnosis not present

## 2017-03-06 DIAGNOSIS — Z01810 Encounter for preprocedural cardiovascular examination: Secondary | ICD-10-CM | POA: Diagnosis not present

## 2017-03-09 DIAGNOSIS — I2510 Atherosclerotic heart disease of native coronary artery without angina pectoris: Secondary | ICD-10-CM | POA: Diagnosis not present

## 2017-03-09 DIAGNOSIS — S86012A Strain of left Achilles tendon, initial encounter: Secondary | ICD-10-CM | POA: Diagnosis not present

## 2017-03-09 DIAGNOSIS — Z7951 Long term (current) use of inhaled steroids: Secondary | ICD-10-CM | POA: Diagnosis not present

## 2017-03-09 DIAGNOSIS — E785 Hyperlipidemia, unspecified: Secondary | ICD-10-CM | POA: Diagnosis not present

## 2017-03-09 DIAGNOSIS — Z79899 Other long term (current) drug therapy: Secondary | ICD-10-CM | POA: Diagnosis not present

## 2017-03-09 DIAGNOSIS — I10 Essential (primary) hypertension: Secondary | ICD-10-CM | POA: Diagnosis not present

## 2017-03-09 DIAGNOSIS — M7732 Calcaneal spur, left foot: Secondary | ICD-10-CM | POA: Diagnosis not present

## 2017-03-09 DIAGNOSIS — I252 Old myocardial infarction: Secondary | ICD-10-CM | POA: Diagnosis not present

## 2017-03-09 DIAGNOSIS — Z85828 Personal history of other malignant neoplasm of skin: Secondary | ICD-10-CM | POA: Diagnosis not present

## 2017-03-09 DIAGNOSIS — M7662 Achilles tendinitis, left leg: Secondary | ICD-10-CM | POA: Diagnosis not present

## 2017-03-09 DIAGNOSIS — Z791 Long term (current) use of non-steroidal anti-inflammatories (NSAID): Secondary | ICD-10-CM | POA: Diagnosis not present

## 2017-03-09 DIAGNOSIS — N401 Enlarged prostate with lower urinary tract symptoms: Secondary | ICD-10-CM | POA: Diagnosis not present

## 2017-03-09 DIAGNOSIS — M7752 Other enthesopathy of left foot: Secondary | ICD-10-CM | POA: Diagnosis not present

## 2017-03-09 DIAGNOSIS — Z79891 Long term (current) use of opiate analgesic: Secondary | ICD-10-CM | POA: Diagnosis not present

## 2017-03-09 DIAGNOSIS — G8929 Other chronic pain: Secondary | ICD-10-CM | POA: Diagnosis not present

## 2017-03-09 DIAGNOSIS — Z9889 Other specified postprocedural states: Secondary | ICD-10-CM | POA: Diagnosis not present

## 2017-03-09 DIAGNOSIS — Z7984 Long term (current) use of oral hypoglycemic drugs: Secondary | ICD-10-CM | POA: Diagnosis not present

## 2017-03-09 DIAGNOSIS — Z7902 Long term (current) use of antithrombotics/antiplatelets: Secondary | ICD-10-CM | POA: Diagnosis not present

## 2017-03-09 DIAGNOSIS — E119 Type 2 diabetes mellitus without complications: Secondary | ICD-10-CM | POA: Diagnosis not present

## 2017-03-15 DIAGNOSIS — M7662 Achilles tendinitis, left leg: Secondary | ICD-10-CM | POA: Diagnosis not present

## 2017-03-15 DIAGNOSIS — S86012D Strain of left Achilles tendon, subsequent encounter: Secondary | ICD-10-CM | POA: Diagnosis not present

## 2017-03-15 DIAGNOSIS — M7752 Other enthesopathy of left foot: Secondary | ICD-10-CM | POA: Diagnosis not present

## 2017-03-21 ENCOUNTER — Other Ambulatory Visit: Payer: Self-pay | Admitting: Interventional Cardiology

## 2017-03-24 DIAGNOSIS — M7662 Achilles tendinitis, left leg: Secondary | ICD-10-CM | POA: Diagnosis not present

## 2017-03-24 DIAGNOSIS — S86012D Strain of left Achilles tendon, subsequent encounter: Secondary | ICD-10-CM | POA: Diagnosis not present

## 2017-03-24 DIAGNOSIS — M7752 Other enthesopathy of left foot: Secondary | ICD-10-CM | POA: Diagnosis not present

## 2017-03-24 DIAGNOSIS — Z4802 Encounter for removal of sutures: Secondary | ICD-10-CM | POA: Diagnosis not present

## 2017-04-12 DIAGNOSIS — S86012D Strain of left Achilles tendon, subsequent encounter: Secondary | ICD-10-CM | POA: Diagnosis not present

## 2017-04-12 DIAGNOSIS — M7662 Achilles tendinitis, left leg: Secondary | ICD-10-CM | POA: Diagnosis not present

## 2017-04-12 DIAGNOSIS — M7752 Other enthesopathy of left foot: Secondary | ICD-10-CM | POA: Diagnosis not present

## 2017-04-13 ENCOUNTER — Encounter (INDEPENDENT_AMBULATORY_CARE_PROVIDER_SITE_OTHER): Payer: Self-pay

## 2017-04-13 ENCOUNTER — Encounter: Payer: Self-pay | Admitting: Interventional Cardiology

## 2017-04-13 ENCOUNTER — Ambulatory Visit (INDEPENDENT_AMBULATORY_CARE_PROVIDER_SITE_OTHER): Payer: Medicare Other | Admitting: Interventional Cardiology

## 2017-04-13 VITALS — BP 118/60 | HR 69 | Ht 67.0 in | Wt 145.0 lb

## 2017-04-13 DIAGNOSIS — E782 Mixed hyperlipidemia: Secondary | ICD-10-CM | POA: Diagnosis not present

## 2017-04-13 DIAGNOSIS — I10 Essential (primary) hypertension: Secondary | ICD-10-CM

## 2017-04-13 DIAGNOSIS — I25119 Atherosclerotic heart disease of native coronary artery with unspecified angina pectoris: Secondary | ICD-10-CM

## 2017-04-13 DIAGNOSIS — I209 Angina pectoris, unspecified: Secondary | ICD-10-CM | POA: Diagnosis not present

## 2017-04-13 DIAGNOSIS — E1159 Type 2 diabetes mellitus with other circulatory complications: Secondary | ICD-10-CM

## 2017-04-13 NOTE — Progress Notes (Signed)
Cardiology Office Note   Date:  04/13/2017   ID:  Zavion, Sleight 06-Jan-1941, MRN [id number]  PCP:  Lawerance Cruel, MD    No chief complaint on file. CAD   Wt Readings from Last 3 Encounters:  04/13/17 145 lb (65.8 kg)  04/13/16 155 lb (70.3 kg)  02/12/16 153 lb (69.4 kg)       History of Present Illness: Jon Sanchez is a 76 y.o. male  who has had a DES to the LAD in 5/07. He has hearing loss and tinnitus in the right ear.  Diagnosed with DM.  He lost weight and was started on metformin.  This was stopped later.  He is now retired  He had an achilles tendon problem.  He is now in a boot.  He was treated with exercise.  He had another fall due to the foot problem. Surgery was then required.  No cardiac issues during surgery.    Denies : Chest pain. Dizziness. Leg edema. Nitroglycerin use. Orthopnea. Palpitations. Paroxysmal nocturnal dyspnea. Shortness of breath. Syncope.   Exercise has been limited due to the above.   Appetite decreased with ankle injury.      Past Medical History:  Diagnosis Date  . Actinic keratoses   . Allergic rhinitis   . Anxiety   . Benign essential tremor   . CAD (coronary artery disease)   . Chest pain   . Diabetes ([place])   . GERD (gastroesophageal reflux disease)   . Hyperlipidemia   . Myocardial infarct ([place])   . Osteoarthrosis, unspecified whether generalized or localized, hand   . Pain in joint     Past Surgical History:  Procedure Laterality Date  . CORONARY ANGIOPLASTY  2007     Current Outpatient Prescriptions  Medication Sig Dispense Refill  . atorvastatin (LIPITOR) 20 MG tablet TAKE 1 TABLET(20 MG) BY MOUTH DAILY 90 tablet 3  . B Complex Vitamins (VITAMIN B COMPLEX PO) Take 1 tablet by mouth daily.    . clopidogrel (PLAVIX) 75 MG tablet TAKE 1 TABLET BY MOUTH DAILY 30 tablet 0  . LORazepam (ATIVAN) 0.5 MG tablet Take 0.5 mg by mouth every 4 (four) hours as needed for anxiety.     Marland Kitchen losartan (COZAAR) 25  MG tablet TAKE 1 TABLET (25 MG TOTAL) BY MOUTH DAILY. 90 tablet 2  . Multiple Vitamin (MULTI-VITAMINS) TABS Take 1 tablet by mouth daily.     . nitroGLYCERIN (NITROSTAT) 0.4 MG SL tablet DISSOLVE 1 TABLET UNDER THE TONGUE EVERY 5 MINUTES AS NEEDED FOR CHEST PAIN 75 tablet 0  . Omega-3 Fatty Acids (FISH OIL PO) Take 1 tablet by mouth daily.    Marland Kitchen PROAIR HFA 108 (90 BASE) MCG/ACT inhaler Inhale 2 puffs into the lungs every 6 (six) hours as needed (WHEEZING).     Marland Kitchen propranolol ER (INDERAL LA) 60 MG 24 hr capsule Take 60 mg by mouth daily.     No current facility-administered medications for this visit.     Allergies:   Codeine    Social History:  The patient  reports that he has never smoked. He has never used smokeless tobacco. He reports that he does not drink alcohol or use drugs.   Family History:  The patient's family history includes Colon cancer in his mother; Coronary artery disease in his unknown relative; Heart attack in his father; Hypertension in his mother; Lung cancer in his father.    ROS:  Please see the history of  present illness.   Otherwise, review of systems are positive for left achilles problem.   All other systems are reviewed and negative.    PHYSICAL EXAM: VS:  BP 118/60   Pulse 69   Ht 5\' 7"  (1.702 m)   Wt 145 lb (65.8 kg) Comment: Pt was unable to weigh and told weight  SpO2 95%   BMI 22.71 kg/m  , BMI Body mass index is 22.71 kg/m. GEN: Well nourished, well developed, in no acute distress  HEENT: normal  Neck: no JVD, carotid bruits, or masses Cardiac: RRR; no murmurs, rubs, or gallops,no edema  Respiratory:  clear to auscultation bilaterally, normal work of breathing GI: soft, nontender, nondistended, + BS MS: no deformity or atrophy  Skin: warm and dry, no rash Neuro:  Strength and sensation are intact Psych: euthymic mood, full affect   EKG:   The ekg ordered today demonstrates NSR, no ST segment changes   Recent Labs: 10/11/2016: ALT 15; BUN  14; Creatinine, Ser 0.74; Potassium 4.8; Sodium 142   Lipid Panel    Component Value Date/Time   CHOL 106 10/11/2016 1443   CHOL 99 07/27/2013 0907   TRIG 159 (H) 10/11/2016 1443   TRIG 102 07/27/2013 0907   HDL 31 (L) 10/11/2016 1443   HDL 35 (L) 07/27/2013 0907   CHOLHDL 3.4 10/11/2016 1443   CHOLHDL 3.2 09/01/2015 0845   VLDL 22 09/01/2015 0845   LDLCALC 43 10/11/2016 1443   LDLCALC 44 07/27/2013 0907     Other studies Reviewed: Additional studies/ records that were reviewed today with results demonstrating: Labs reviewed as noted below.   ASSESSMENT AND PLAN:  1. CAD: No angina on medical therapy.  Continue current meds.   2. DM: Last A1C was 6.1.  Metformin was stopped.  Weight loss has helped his sugar. 3. Hyperlipidemia: LDL 39. Continue simvastatin.  4. HTN: Cr 0.75.  Continue losartan.  Well controlled.     Current medicines are reviewed at length with the patient today.  The patient concerns regarding his medicines were addressed.  The following changes have been made:  No change  Labs/ tests ordered today include:  No orders of the defined types were placed in this encounter.   Recommend 150 minutes/week of aerobic exercise Low fat, low carb, high fiber diet recommended  Disposition:   FU in 1 year   Signed, Larae Grooms, MD  04/13/2017 11:30 AM    [facility] [place], [place], [place]  [address] Phone: [phone]; Fax: [phone]

## 2017-04-13 NOTE — Patient Instructions (Signed)

## 2017-04-22 DIAGNOSIS — M6281 Muscle weakness (generalized): Secondary | ICD-10-CM | POA: Diagnosis not present

## 2017-04-22 DIAGNOSIS — M25572 Pain in left ankle and joints of left foot: Secondary | ICD-10-CM | POA: Diagnosis not present

## 2017-04-22 DIAGNOSIS — R2689 Other abnormalities of gait and mobility: Secondary | ICD-10-CM | POA: Diagnosis not present

## 2017-04-22 DIAGNOSIS — M7752 Other enthesopathy of left foot: Secondary | ICD-10-CM | POA: Diagnosis not present

## 2017-04-22 DIAGNOSIS — G8929 Other chronic pain: Secondary | ICD-10-CM | POA: Diagnosis not present

## 2017-04-22 DIAGNOSIS — M7662 Achilles tendinitis, left leg: Secondary | ICD-10-CM | POA: Diagnosis not present

## 2017-04-22 DIAGNOSIS — Z7409 Other reduced mobility: Secondary | ICD-10-CM | POA: Diagnosis not present

## 2017-04-22 DIAGNOSIS — S86012A Strain of left Achilles tendon, initial encounter: Secondary | ICD-10-CM | POA: Diagnosis not present

## 2017-04-22 DIAGNOSIS — M25672 Stiffness of left ankle, not elsewhere classified: Secondary | ICD-10-CM | POA: Diagnosis not present

## 2017-04-25 ENCOUNTER — Other Ambulatory Visit: Payer: Self-pay | Admitting: Interventional Cardiology

## 2017-04-25 DIAGNOSIS — M6281 Muscle weakness (generalized): Secondary | ICD-10-CM | POA: Diagnosis not present

## 2017-04-25 DIAGNOSIS — M7662 Achilles tendinitis, left leg: Secondary | ICD-10-CM | POA: Diagnosis not present

## 2017-04-25 DIAGNOSIS — M7752 Other enthesopathy of left foot: Secondary | ICD-10-CM | POA: Diagnosis not present

## 2017-04-25 DIAGNOSIS — S86012A Strain of left Achilles tendon, initial encounter: Secondary | ICD-10-CM | POA: Diagnosis not present

## 2017-04-25 DIAGNOSIS — M25672 Stiffness of left ankle, not elsewhere classified: Secondary | ICD-10-CM | POA: Diagnosis not present

## 2017-04-25 DIAGNOSIS — M25572 Pain in left ankle and joints of left foot: Secondary | ICD-10-CM | POA: Diagnosis not present

## 2017-04-28 DIAGNOSIS — M7752 Other enthesopathy of left foot: Secondary | ICD-10-CM | POA: Diagnosis not present

## 2017-04-28 DIAGNOSIS — M25572 Pain in left ankle and joints of left foot: Secondary | ICD-10-CM | POA: Diagnosis not present

## 2017-04-28 DIAGNOSIS — M25672 Stiffness of left ankle, not elsewhere classified: Secondary | ICD-10-CM | POA: Diagnosis not present

## 2017-04-28 DIAGNOSIS — M6281 Muscle weakness (generalized): Secondary | ICD-10-CM | POA: Diagnosis not present

## 2017-04-28 DIAGNOSIS — S86012A Strain of left Achilles tendon, initial encounter: Secondary | ICD-10-CM | POA: Diagnosis not present

## 2017-04-28 DIAGNOSIS — M7662 Achilles tendinitis, left leg: Secondary | ICD-10-CM | POA: Diagnosis not present

## 2017-05-02 DIAGNOSIS — S86012A Strain of left Achilles tendon, initial encounter: Secondary | ICD-10-CM | POA: Diagnosis not present

## 2017-05-02 DIAGNOSIS — M25672 Stiffness of left ankle, not elsewhere classified: Secondary | ICD-10-CM | POA: Diagnosis not present

## 2017-05-02 DIAGNOSIS — M7752 Other enthesopathy of left foot: Secondary | ICD-10-CM | POA: Diagnosis not present

## 2017-05-02 DIAGNOSIS — M7662 Achilles tendinitis, left leg: Secondary | ICD-10-CM | POA: Diagnosis not present

## 2017-05-02 DIAGNOSIS — M25572 Pain in left ankle and joints of left foot: Secondary | ICD-10-CM | POA: Diagnosis not present

## 2017-05-02 DIAGNOSIS — M6281 Muscle weakness (generalized): Secondary | ICD-10-CM | POA: Diagnosis not present

## 2017-05-05 DIAGNOSIS — M6281 Muscle weakness (generalized): Secondary | ICD-10-CM | POA: Diagnosis not present

## 2017-05-05 DIAGNOSIS — M25572 Pain in left ankle and joints of left foot: Secondary | ICD-10-CM | POA: Diagnosis not present

## 2017-05-05 DIAGNOSIS — S86012A Strain of left Achilles tendon, initial encounter: Secondary | ICD-10-CM | POA: Diagnosis not present

## 2017-05-05 DIAGNOSIS — M7752 Other enthesopathy of left foot: Secondary | ICD-10-CM | POA: Diagnosis not present

## 2017-05-05 DIAGNOSIS — M7662 Achilles tendinitis, left leg: Secondary | ICD-10-CM | POA: Diagnosis not present

## 2017-05-05 DIAGNOSIS — M25672 Stiffness of left ankle, not elsewhere classified: Secondary | ICD-10-CM | POA: Diagnosis not present

## 2017-05-09 DIAGNOSIS — M7752 Other enthesopathy of left foot: Secondary | ICD-10-CM | POA: Diagnosis not present

## 2017-05-09 DIAGNOSIS — M25672 Stiffness of left ankle, not elsewhere classified: Secondary | ICD-10-CM | POA: Diagnosis not present

## 2017-05-09 DIAGNOSIS — S86012A Strain of left Achilles tendon, initial encounter: Secondary | ICD-10-CM | POA: Diagnosis not present

## 2017-05-09 DIAGNOSIS — M25572 Pain in left ankle and joints of left foot: Secondary | ICD-10-CM | POA: Diagnosis not present

## 2017-05-09 DIAGNOSIS — M6281 Muscle weakness (generalized): Secondary | ICD-10-CM | POA: Diagnosis not present

## 2017-05-09 DIAGNOSIS — M7662 Achilles tendinitis, left leg: Secondary | ICD-10-CM | POA: Diagnosis not present

## 2017-05-10 DIAGNOSIS — S86012D Strain of left Achilles tendon, subsequent encounter: Secondary | ICD-10-CM | POA: Diagnosis not present

## 2017-05-10 DIAGNOSIS — Z4789 Encounter for other orthopedic aftercare: Secondary | ICD-10-CM | POA: Diagnosis not present

## 2017-05-10 DIAGNOSIS — M7752 Other enthesopathy of left foot: Secondary | ICD-10-CM | POA: Diagnosis not present

## 2017-05-11 DIAGNOSIS — M6281 Muscle weakness (generalized): Secondary | ICD-10-CM | POA: Diagnosis not present

## 2017-05-11 DIAGNOSIS — M25572 Pain in left ankle and joints of left foot: Secondary | ICD-10-CM | POA: Diagnosis not present

## 2017-05-11 DIAGNOSIS — M25672 Stiffness of left ankle, not elsewhere classified: Secondary | ICD-10-CM | POA: Diagnosis not present

## 2017-05-11 DIAGNOSIS — M7752 Other enthesopathy of left foot: Secondary | ICD-10-CM | POA: Diagnosis not present

## 2017-05-11 DIAGNOSIS — S86012A Strain of left Achilles tendon, initial encounter: Secondary | ICD-10-CM | POA: Diagnosis not present

## 2017-05-11 DIAGNOSIS — M7662 Achilles tendinitis, left leg: Secondary | ICD-10-CM | POA: Diagnosis not present

## 2017-05-18 DIAGNOSIS — M7752 Other enthesopathy of left foot: Secondary | ICD-10-CM | POA: Diagnosis not present

## 2017-05-18 DIAGNOSIS — M25672 Stiffness of left ankle, not elsewhere classified: Secondary | ICD-10-CM | POA: Diagnosis not present

## 2017-05-18 DIAGNOSIS — M25572 Pain in left ankle and joints of left foot: Secondary | ICD-10-CM | POA: Diagnosis not present

## 2017-05-18 DIAGNOSIS — S86012A Strain of left Achilles tendon, initial encounter: Secondary | ICD-10-CM | POA: Diagnosis not present

## 2017-05-18 DIAGNOSIS — M6281 Muscle weakness (generalized): Secondary | ICD-10-CM | POA: Diagnosis not present

## 2017-05-18 DIAGNOSIS — M7662 Achilles tendinitis, left leg: Secondary | ICD-10-CM | POA: Diagnosis not present

## 2017-05-19 DIAGNOSIS — J329 Chronic sinusitis, unspecified: Secondary | ICD-10-CM | POA: Diagnosis not present

## 2017-05-19 DIAGNOSIS — F419 Anxiety disorder, unspecified: Secondary | ICD-10-CM | POA: Diagnosis not present

## 2017-05-19 DIAGNOSIS — E1165 Type 2 diabetes mellitus with hyperglycemia: Secondary | ICD-10-CM | POA: Diagnosis not present

## 2017-05-23 DIAGNOSIS — M7662 Achilles tendinitis, left leg: Secondary | ICD-10-CM | POA: Diagnosis not present

## 2017-05-23 DIAGNOSIS — Z9889 Other specified postprocedural states: Secondary | ICD-10-CM | POA: Diagnosis not present

## 2017-05-23 DIAGNOSIS — M25672 Stiffness of left ankle, not elsewhere classified: Secondary | ICD-10-CM | POA: Diagnosis not present

## 2017-05-23 DIAGNOSIS — M7752 Other enthesopathy of left foot: Secondary | ICD-10-CM | POA: Diagnosis not present

## 2017-05-23 DIAGNOSIS — M25572 Pain in left ankle and joints of left foot: Secondary | ICD-10-CM | POA: Diagnosis not present

## 2017-05-23 DIAGNOSIS — R2689 Other abnormalities of gait and mobility: Secondary | ICD-10-CM | POA: Diagnosis not present

## 2017-05-23 DIAGNOSIS — M6281 Muscle weakness (generalized): Secondary | ICD-10-CM | POA: Diagnosis not present

## 2017-05-23 DIAGNOSIS — S86012S Strain of left Achilles tendon, sequela: Secondary | ICD-10-CM | POA: Diagnosis not present

## 2017-05-23 DIAGNOSIS — Z7409 Other reduced mobility: Secondary | ICD-10-CM | POA: Diagnosis not present

## 2017-05-26 DIAGNOSIS — M7662 Achilles tendinitis, left leg: Secondary | ICD-10-CM | POA: Diagnosis not present

## 2017-05-26 DIAGNOSIS — M25572 Pain in left ankle and joints of left foot: Secondary | ICD-10-CM | POA: Diagnosis not present

## 2017-05-26 DIAGNOSIS — R2689 Other abnormalities of gait and mobility: Secondary | ICD-10-CM | POA: Diagnosis not present

## 2017-05-26 DIAGNOSIS — M7752 Other enthesopathy of left foot: Secondary | ICD-10-CM | POA: Diagnosis not present

## 2017-05-26 DIAGNOSIS — M25672 Stiffness of left ankle, not elsewhere classified: Secondary | ICD-10-CM | POA: Diagnosis not present

## 2017-05-26 DIAGNOSIS — M6281 Muscle weakness (generalized): Secondary | ICD-10-CM | POA: Diagnosis not present

## 2017-06-02 DIAGNOSIS — M25672 Stiffness of left ankle, not elsewhere classified: Secondary | ICD-10-CM | POA: Diagnosis not present

## 2017-06-02 DIAGNOSIS — R2689 Other abnormalities of gait and mobility: Secondary | ICD-10-CM | POA: Diagnosis not present

## 2017-06-02 DIAGNOSIS — M25572 Pain in left ankle and joints of left foot: Secondary | ICD-10-CM | POA: Diagnosis not present

## 2017-06-02 DIAGNOSIS — M7752 Other enthesopathy of left foot: Secondary | ICD-10-CM | POA: Diagnosis not present

## 2017-06-02 DIAGNOSIS — M7662 Achilles tendinitis, left leg: Secondary | ICD-10-CM | POA: Diagnosis not present

## 2017-06-02 DIAGNOSIS — M6281 Muscle weakness (generalized): Secondary | ICD-10-CM | POA: Diagnosis not present

## 2017-06-06 DIAGNOSIS — M6281 Muscle weakness (generalized): Secondary | ICD-10-CM | POA: Diagnosis not present

## 2017-06-06 DIAGNOSIS — M25572 Pain in left ankle and joints of left foot: Secondary | ICD-10-CM | POA: Diagnosis not present

## 2017-06-06 DIAGNOSIS — R2689 Other abnormalities of gait and mobility: Secondary | ICD-10-CM | POA: Diagnosis not present

## 2017-06-06 DIAGNOSIS — M7662 Achilles tendinitis, left leg: Secondary | ICD-10-CM | POA: Diagnosis not present

## 2017-06-06 DIAGNOSIS — M25672 Stiffness of left ankle, not elsewhere classified: Secondary | ICD-10-CM | POA: Diagnosis not present

## 2017-06-06 DIAGNOSIS — M7752 Other enthesopathy of left foot: Secondary | ICD-10-CM | POA: Diagnosis not present

## 2017-06-09 DIAGNOSIS — M25672 Stiffness of left ankle, not elsewhere classified: Secondary | ICD-10-CM | POA: Diagnosis not present

## 2017-06-09 DIAGNOSIS — R2689 Other abnormalities of gait and mobility: Secondary | ICD-10-CM | POA: Diagnosis not present

## 2017-06-09 DIAGNOSIS — M7752 Other enthesopathy of left foot: Secondary | ICD-10-CM | POA: Diagnosis not present

## 2017-06-09 DIAGNOSIS — M7662 Achilles tendinitis, left leg: Secondary | ICD-10-CM | POA: Diagnosis not present

## 2017-06-09 DIAGNOSIS — M6281 Muscle weakness (generalized): Secondary | ICD-10-CM | POA: Diagnosis not present

## 2017-06-09 DIAGNOSIS — M25572 Pain in left ankle and joints of left foot: Secondary | ICD-10-CM | POA: Diagnosis not present

## 2017-06-15 DIAGNOSIS — R2689 Other abnormalities of gait and mobility: Secondary | ICD-10-CM | POA: Diagnosis not present

## 2017-06-15 DIAGNOSIS — M7662 Achilles tendinitis, left leg: Secondary | ICD-10-CM | POA: Diagnosis not present

## 2017-06-15 DIAGNOSIS — M6281 Muscle weakness (generalized): Secondary | ICD-10-CM | POA: Diagnosis not present

## 2017-06-15 DIAGNOSIS — M25672 Stiffness of left ankle, not elsewhere classified: Secondary | ICD-10-CM | POA: Diagnosis not present

## 2017-06-15 DIAGNOSIS — M7752 Other enthesopathy of left foot: Secondary | ICD-10-CM | POA: Diagnosis not present

## 2017-06-15 DIAGNOSIS — M25572 Pain in left ankle and joints of left foot: Secondary | ICD-10-CM | POA: Diagnosis not present

## 2017-06-17 DIAGNOSIS — R2689 Other abnormalities of gait and mobility: Secondary | ICD-10-CM | POA: Diagnosis not present

## 2017-06-17 DIAGNOSIS — M25572 Pain in left ankle and joints of left foot: Secondary | ICD-10-CM | POA: Diagnosis not present

## 2017-06-17 DIAGNOSIS — M7752 Other enthesopathy of left foot: Secondary | ICD-10-CM | POA: Diagnosis not present

## 2017-06-17 DIAGNOSIS — M25672 Stiffness of left ankle, not elsewhere classified: Secondary | ICD-10-CM | POA: Diagnosis not present

## 2017-06-17 DIAGNOSIS — M6281 Muscle weakness (generalized): Secondary | ICD-10-CM | POA: Diagnosis not present

## 2017-06-17 DIAGNOSIS — M7662 Achilles tendinitis, left leg: Secondary | ICD-10-CM | POA: Diagnosis not present

## 2017-06-23 DIAGNOSIS — M7752 Other enthesopathy of left foot: Secondary | ICD-10-CM | POA: Diagnosis not present

## 2017-06-23 DIAGNOSIS — M25572 Pain in left ankle and joints of left foot: Secondary | ICD-10-CM | POA: Diagnosis not present

## 2017-06-23 DIAGNOSIS — M7662 Achilles tendinitis, left leg: Secondary | ICD-10-CM | POA: Diagnosis not present

## 2017-06-23 DIAGNOSIS — G8929 Other chronic pain: Secondary | ICD-10-CM | POA: Diagnosis not present

## 2017-06-23 DIAGNOSIS — Z87828 Personal history of other (healed) physical injury and trauma: Secondary | ICD-10-CM | POA: Diagnosis not present

## 2017-06-27 ENCOUNTER — Other Ambulatory Visit: Payer: Self-pay | Admitting: Interventional Cardiology

## 2017-06-27 DIAGNOSIS — N401 Enlarged prostate with lower urinary tract symptoms: Secondary | ICD-10-CM | POA: Diagnosis not present

## 2017-06-27 DIAGNOSIS — N138 Other obstructive and reflux uropathy: Secondary | ICD-10-CM | POA: Diagnosis not present

## 2017-06-27 DIAGNOSIS — N529 Male erectile dysfunction, unspecified: Secondary | ICD-10-CM | POA: Diagnosis not present

## 2017-06-27 DIAGNOSIS — N411 Chronic prostatitis: Secondary | ICD-10-CM | POA: Diagnosis not present

## 2017-06-27 DIAGNOSIS — E0800 Diabetes mellitus due to underlying condition with hyperosmolarity without nonketotic hyperglycemic-hyperosmolar coma (NKHHC): Secondary | ICD-10-CM | POA: Diagnosis not present

## 2017-06-27 DIAGNOSIS — N35014 Post-traumatic urethral stricture, male, unspecified: Secondary | ICD-10-CM | POA: Diagnosis not present

## 2017-06-27 MED ORDER — ATORVASTATIN CALCIUM 20 MG PO TABS: ORAL_TABLET | ORAL | 2 refills | Status: DC

## 2017-06-28 DIAGNOSIS — Z87828 Personal history of other (healed) physical injury and trauma: Secondary | ICD-10-CM | POA: Diagnosis not present

## 2017-06-28 DIAGNOSIS — G8929 Other chronic pain: Secondary | ICD-10-CM | POA: Diagnosis not present

## 2017-06-28 DIAGNOSIS — Z9889 Other specified postprocedural states: Secondary | ICD-10-CM | POA: Diagnosis not present

## 2017-06-28 DIAGNOSIS — M25572 Pain in left ankle and joints of left foot: Secondary | ICD-10-CM | POA: Diagnosis not present

## 2017-06-28 DIAGNOSIS — M7662 Achilles tendinitis, left leg: Secondary | ICD-10-CM | POA: Diagnosis not present

## 2017-06-28 DIAGNOSIS — S86012D Strain of left Achilles tendon, subsequent encounter: Secondary | ICD-10-CM | POA: Diagnosis not present

## 2017-06-28 DIAGNOSIS — M7752 Other enthesopathy of left foot: Secondary | ICD-10-CM | POA: Diagnosis not present

## 2017-06-30 DIAGNOSIS — M25572 Pain in left ankle and joints of left foot: Secondary | ICD-10-CM | POA: Diagnosis not present

## 2017-06-30 DIAGNOSIS — M7752 Other enthesopathy of left foot: Secondary | ICD-10-CM | POA: Diagnosis not present

## 2017-06-30 DIAGNOSIS — Z87828 Personal history of other (healed) physical injury and trauma: Secondary | ICD-10-CM | POA: Diagnosis not present

## 2017-06-30 DIAGNOSIS — G8929 Other chronic pain: Secondary | ICD-10-CM | POA: Diagnosis not present

## 2017-06-30 DIAGNOSIS — M7662 Achilles tendinitis, left leg: Secondary | ICD-10-CM | POA: Diagnosis not present

## 2017-07-05 DIAGNOSIS — M7752 Other enthesopathy of left foot: Secondary | ICD-10-CM | POA: Diagnosis not present

## 2017-07-05 DIAGNOSIS — M25572 Pain in left ankle and joints of left foot: Secondary | ICD-10-CM | POA: Diagnosis not present

## 2017-07-05 DIAGNOSIS — Z87828 Personal history of other (healed) physical injury and trauma: Secondary | ICD-10-CM | POA: Diagnosis not present

## 2017-07-05 DIAGNOSIS — G8929 Other chronic pain: Secondary | ICD-10-CM | POA: Diagnosis not present

## 2017-07-05 DIAGNOSIS — M7662 Achilles tendinitis, left leg: Secondary | ICD-10-CM | POA: Diagnosis not present

## 2017-07-07 DIAGNOSIS — G8929 Other chronic pain: Secondary | ICD-10-CM | POA: Diagnosis not present

## 2017-07-07 DIAGNOSIS — M25572 Pain in left ankle and joints of left foot: Secondary | ICD-10-CM | POA: Diagnosis not present

## 2017-07-07 DIAGNOSIS — M7752 Other enthesopathy of left foot: Secondary | ICD-10-CM | POA: Diagnosis not present

## 2017-07-07 DIAGNOSIS — Z87828 Personal history of other (healed) physical injury and trauma: Secondary | ICD-10-CM | POA: Diagnosis not present

## 2017-07-07 DIAGNOSIS — M7662 Achilles tendinitis, left leg: Secondary | ICD-10-CM | POA: Diagnosis not present

## 2017-07-12 DIAGNOSIS — G8929 Other chronic pain: Secondary | ICD-10-CM | POA: Diagnosis not present

## 2017-07-12 DIAGNOSIS — M25572 Pain in left ankle and joints of left foot: Secondary | ICD-10-CM | POA: Diagnosis not present

## 2017-07-12 DIAGNOSIS — M7752 Other enthesopathy of left foot: Secondary | ICD-10-CM | POA: Diagnosis not present

## 2017-07-12 DIAGNOSIS — Z87828 Personal history of other (healed) physical injury and trauma: Secondary | ICD-10-CM | POA: Diagnosis not present

## 2017-07-12 DIAGNOSIS — M7662 Achilles tendinitis, left leg: Secondary | ICD-10-CM | POA: Diagnosis not present

## 2017-07-14 DIAGNOSIS — M25572 Pain in left ankle and joints of left foot: Secondary | ICD-10-CM | POA: Diagnosis not present

## 2017-07-14 DIAGNOSIS — G8929 Other chronic pain: Secondary | ICD-10-CM | POA: Diagnosis not present

## 2017-07-14 DIAGNOSIS — M7662 Achilles tendinitis, left leg: Secondary | ICD-10-CM | POA: Diagnosis not present

## 2017-07-14 DIAGNOSIS — M7752 Other enthesopathy of left foot: Secondary | ICD-10-CM | POA: Diagnosis not present

## 2017-07-14 DIAGNOSIS — Z87828 Personal history of other (healed) physical injury and trauma: Secondary | ICD-10-CM | POA: Diagnosis not present

## 2017-07-18 DIAGNOSIS — M7662 Achilles tendinitis, left leg: Secondary | ICD-10-CM | POA: Diagnosis not present

## 2017-07-18 DIAGNOSIS — G8929 Other chronic pain: Secondary | ICD-10-CM | POA: Diagnosis not present

## 2017-07-18 DIAGNOSIS — Z87828 Personal history of other (healed) physical injury and trauma: Secondary | ICD-10-CM | POA: Diagnosis not present

## 2017-07-18 DIAGNOSIS — M7752 Other enthesopathy of left foot: Secondary | ICD-10-CM | POA: Diagnosis not present

## 2017-07-18 DIAGNOSIS — M25572 Pain in left ankle and joints of left foot: Secondary | ICD-10-CM | POA: Diagnosis not present

## 2017-07-21 DIAGNOSIS — E782 Mixed hyperlipidemia: Secondary | ICD-10-CM | POA: Diagnosis not present

## 2017-07-21 DIAGNOSIS — E119 Type 2 diabetes mellitus without complications: Secondary | ICD-10-CM | POA: Diagnosis not present

## 2017-07-21 DIAGNOSIS — M25572 Pain in left ankle and joints of left foot: Secondary | ICD-10-CM | POA: Diagnosis not present

## 2017-07-21 DIAGNOSIS — Z87828 Personal history of other (healed) physical injury and trauma: Secondary | ICD-10-CM | POA: Diagnosis not present

## 2017-07-21 DIAGNOSIS — K30 Functional dyspepsia: Secondary | ICD-10-CM | POA: Diagnosis not present

## 2017-07-21 DIAGNOSIS — M7752 Other enthesopathy of left foot: Secondary | ICD-10-CM | POA: Diagnosis not present

## 2017-07-21 DIAGNOSIS — G479 Sleep disorder, unspecified: Secondary | ICD-10-CM | POA: Diagnosis not present

## 2017-07-21 DIAGNOSIS — G8929 Other chronic pain: Secondary | ICD-10-CM | POA: Diagnosis not present

## 2017-07-21 DIAGNOSIS — I10 Essential (primary) hypertension: Secondary | ICD-10-CM | POA: Diagnosis not present

## 2017-07-21 DIAGNOSIS — F419 Anxiety disorder, unspecified: Secondary | ICD-10-CM | POA: Diagnosis not present

## 2017-07-21 DIAGNOSIS — M7662 Achilles tendinitis, left leg: Secondary | ICD-10-CM | POA: Diagnosis not present

## 2017-07-22 DIAGNOSIS — N401 Enlarged prostate with lower urinary tract symptoms: Secondary | ICD-10-CM | POA: Diagnosis not present

## 2017-07-22 DIAGNOSIS — N411 Chronic prostatitis: Secondary | ICD-10-CM | POA: Diagnosis not present

## 2017-07-22 DIAGNOSIS — N138 Other obstructive and reflux uropathy: Secondary | ICD-10-CM | POA: Diagnosis not present

## 2017-07-22 DIAGNOSIS — E119 Type 2 diabetes mellitus without complications: Secondary | ICD-10-CM | POA: Diagnosis not present

## 2017-07-22 DIAGNOSIS — N35014 Post-traumatic urethral stricture, male, unspecified: Secondary | ICD-10-CM | POA: Diagnosis not present

## 2017-07-22 DIAGNOSIS — N410 Acute prostatitis: Secondary | ICD-10-CM | POA: Diagnosis not present

## 2017-07-25 DIAGNOSIS — M25672 Stiffness of left ankle, not elsewhere classified: Secondary | ICD-10-CM | POA: Diagnosis not present

## 2017-07-25 DIAGNOSIS — G8929 Other chronic pain: Secondary | ICD-10-CM | POA: Diagnosis not present

## 2017-07-25 DIAGNOSIS — M25572 Pain in left ankle and joints of left foot: Secondary | ICD-10-CM | POA: Diagnosis not present

## 2017-07-25 DIAGNOSIS — S86012A Strain of left Achilles tendon, initial encounter: Secondary | ICD-10-CM | POA: Diagnosis not present

## 2017-07-25 DIAGNOSIS — Z7409 Other reduced mobility: Secondary | ICD-10-CM | POA: Diagnosis not present

## 2017-07-25 DIAGNOSIS — R2689 Other abnormalities of gait and mobility: Secondary | ICD-10-CM | POA: Diagnosis not present

## 2017-07-29 DIAGNOSIS — G8929 Other chronic pain: Secondary | ICD-10-CM | POA: Diagnosis not present

## 2017-07-29 DIAGNOSIS — M25672 Stiffness of left ankle, not elsewhere classified: Secondary | ICD-10-CM | POA: Diagnosis not present

## 2017-07-29 DIAGNOSIS — Z7409 Other reduced mobility: Secondary | ICD-10-CM | POA: Diagnosis not present

## 2017-07-29 DIAGNOSIS — M25572 Pain in left ankle and joints of left foot: Secondary | ICD-10-CM | POA: Diagnosis not present

## 2017-07-29 DIAGNOSIS — R2689 Other abnormalities of gait and mobility: Secondary | ICD-10-CM | POA: Diagnosis not present

## 2017-07-29 DIAGNOSIS — S86012A Strain of left Achilles tendon, initial encounter: Secondary | ICD-10-CM | POA: Diagnosis not present

## 2017-08-01 DIAGNOSIS — G8929 Other chronic pain: Secondary | ICD-10-CM | POA: Diagnosis not present

## 2017-08-01 DIAGNOSIS — Z7409 Other reduced mobility: Secondary | ICD-10-CM | POA: Diagnosis not present

## 2017-08-01 DIAGNOSIS — S86012A Strain of left Achilles tendon, initial encounter: Secondary | ICD-10-CM | POA: Diagnosis not present

## 2017-08-01 DIAGNOSIS — R2689 Other abnormalities of gait and mobility: Secondary | ICD-10-CM | POA: Diagnosis not present

## 2017-08-01 DIAGNOSIS — M25672 Stiffness of left ankle, not elsewhere classified: Secondary | ICD-10-CM | POA: Diagnosis not present

## 2017-08-01 DIAGNOSIS — M25572 Pain in left ankle and joints of left foot: Secondary | ICD-10-CM | POA: Diagnosis not present

## 2017-08-03 DIAGNOSIS — G8929 Other chronic pain: Secondary | ICD-10-CM | POA: Diagnosis not present

## 2017-08-03 DIAGNOSIS — Z7409 Other reduced mobility: Secondary | ICD-10-CM | POA: Diagnosis not present

## 2017-08-03 DIAGNOSIS — M25572 Pain in left ankle and joints of left foot: Secondary | ICD-10-CM | POA: Diagnosis not present

## 2017-08-03 DIAGNOSIS — M25672 Stiffness of left ankle, not elsewhere classified: Secondary | ICD-10-CM | POA: Diagnosis not present

## 2017-08-03 DIAGNOSIS — S86012A Strain of left Achilles tendon, initial encounter: Secondary | ICD-10-CM | POA: Diagnosis not present

## 2017-08-03 DIAGNOSIS — R2689 Other abnormalities of gait and mobility: Secondary | ICD-10-CM | POA: Diagnosis not present

## 2017-08-08 DIAGNOSIS — M25572 Pain in left ankle and joints of left foot: Secondary | ICD-10-CM | POA: Diagnosis not present

## 2017-08-08 DIAGNOSIS — S86012A Strain of left Achilles tendon, initial encounter: Secondary | ICD-10-CM | POA: Diagnosis not present

## 2017-08-08 DIAGNOSIS — R2689 Other abnormalities of gait and mobility: Secondary | ICD-10-CM | POA: Diagnosis not present

## 2017-08-08 DIAGNOSIS — G8929 Other chronic pain: Secondary | ICD-10-CM | POA: Diagnosis not present

## 2017-08-08 DIAGNOSIS — M25672 Stiffness of left ankle, not elsewhere classified: Secondary | ICD-10-CM | POA: Diagnosis not present

## 2017-08-08 DIAGNOSIS — Z7409 Other reduced mobility: Secondary | ICD-10-CM | POA: Diagnosis not present

## 2017-08-15 DIAGNOSIS — M25572 Pain in left ankle and joints of left foot: Secondary | ICD-10-CM | POA: Diagnosis not present

## 2017-08-15 DIAGNOSIS — Z7409 Other reduced mobility: Secondary | ICD-10-CM | POA: Diagnosis not present

## 2017-08-15 DIAGNOSIS — S86012A Strain of left Achilles tendon, initial encounter: Secondary | ICD-10-CM | POA: Diagnosis not present

## 2017-08-15 DIAGNOSIS — G8929 Other chronic pain: Secondary | ICD-10-CM | POA: Diagnosis not present

## 2017-08-15 DIAGNOSIS — M25672 Stiffness of left ankle, not elsewhere classified: Secondary | ICD-10-CM | POA: Diagnosis not present

## 2017-08-15 DIAGNOSIS — R2689 Other abnormalities of gait and mobility: Secondary | ICD-10-CM | POA: Diagnosis not present

## 2017-08-30 ENCOUNTER — Ambulatory Visit: Payer: Medicare Other | Admitting: Registered"

## 2017-08-30 DIAGNOSIS — D1801 Hemangioma of skin and subcutaneous tissue: Secondary | ICD-10-CM | POA: Diagnosis not present

## 2017-08-30 DIAGNOSIS — D489 Neoplasm of uncertain behavior, unspecified: Secondary | ICD-10-CM | POA: Diagnosis not present

## 2017-08-30 DIAGNOSIS — L439 Lichen planus, unspecified: Secondary | ICD-10-CM | POA: Diagnosis not present

## 2017-08-30 DIAGNOSIS — L821 Other seborrheic keratosis: Secondary | ICD-10-CM | POA: Diagnosis not present

## 2017-08-30 DIAGNOSIS — L570 Actinic keratosis: Secondary | ICD-10-CM | POA: Diagnosis not present

## 2017-08-31 ENCOUNTER — Encounter: Payer: Medicare Other | Attending: Family Medicine | Admitting: Registered"

## 2017-08-31 ENCOUNTER — Encounter: Payer: Self-pay | Admitting: Registered"

## 2017-08-31 DIAGNOSIS — Z713 Dietary counseling and surveillance: Secondary | ICD-10-CM | POA: Diagnosis not present

## 2017-08-31 DIAGNOSIS — E119 Type 2 diabetes mellitus without complications: Secondary | ICD-10-CM | POA: Diagnosis not present

## 2017-08-31 NOTE — Progress Notes (Signed)
Diabetes Self-Management Education  Visit Type: First/Initial  Appt. Start Time: 1400 Appt. End Time: 8469  08/31/2017  [name], identified by name and date of birth, is a 77 y.o. male with a diagnosis of Diabetes: Type 2.   ASSESSMENT This patient is accompanied in the office by his spouse. Patient states he asked his doctor to start him on metformin again because he was afraid of the diabetes getting out of control. Patient states that he does not want to check his blood sugar because he doesn't want to see high numbers and that he thinks he is in denial.  After RD provided education on how SMBG can be a feed back tool, patient states he is willing to start checking. Even though an A1c of 6.5% usually doesn't warrant checking BG more than once a day, RD recommended this patient check a few other times due to signs that he has swings in blood sugar including several high FBG in labs up to 172 mg/dL. Pt reports for the last month or so he has been having episodes of feeling tired and nauseated and will feel better after a meal. RD asked him to check BG with these symptoms to see if he is having hypoglycemia.  Patient states he is frustrated with not being able to get as much exercise as he used to, which included walking 1-3 miles per day. After having issues with Achilles Tendon problems/ surgery, patient reports getting very little activity, however he did start using his stationary bike and states it feels good.  Patient states he doesn't get enough sleep, often only 2-3 hours. Patient states he is a light sleeper and has a lot of mind chatter. Patient states that his sleep is also disturbed due to his spouse who has trouble with with her CPAP mask not fitting properly and it will make a lot of noise and she is restless.  Diabetes Self-Management Education - 08/31/17 1424      Visit Information   Visit Type  First/Initial      Initial Visit   Diabetes Type  Type 2    Are you  currently following a meal plan?  No    Are you taking your medications as prescribed?  Yes    Date Diagnosed  2012      Health Coping   How would you rate your overall health?  Good      Psychosocial Assessment   Patient Belief/Attitude about Diabetes  Afraid    How often do you need to have someone help you when you read instructions, pamphlets, or other written materials from your doctor or pharmacy?  1 - Never    What is the last grade level you completed in school?  16      Complications   Last HgB A1C per patient/outside source  6.5 %    How often do you check your blood sugar?  0 times/day (not testing)    Have you had a dilated eye exam in the past 12 months?  Yes    Have you had a dental exam in the past 12 months?  Yes    Are you checking your feet?  Yes    How many days per week are you checking your feet?  6      Dietary Intake   Breakfast  bread, PB, apple OR cheerios, almond milk, blueberries OR eggs, country ham, water, diet coke, coffee splenda    Snack (morning)  nuts  Lunch  ruby tues meat, vegetable OR burger, salad bar or sweet potato, unsweet tea OR cracker barrel catfish, green beans, carrots or cole slaw, mashed potatoes 2-3 pm main meal    Snack (afternoon)  nabs    Snack (evening)  bacon, eggs, sandwich OR cereal OR lunch meat sandwich    Beverage(s)  water, coffee, unsweet tea, diet coke, 1x month beer      Exercise   Exercise Type  Light (walking / raking leaves)    How many days per week to you exercise?  3    How many minutes per day do you exercise?  30    Total minutes per week of exercise  90      Patient Education   Previous Diabetes Education  Yes (please comment) NDES classes 2 yrs ago    Disease state   Definition of diabetes, type 1 and 2, and the diagnosis of diabetes;Factors that contribute to the development of diabetes    Nutrition management   Role of diet in the treatment of diabetes and the relationship between the three main  macronutrients and blood glucose level    Physical activity and exercise   Role of exercise on diabetes management, blood pressure control and cardiac health.    Medications  Reviewed patients medication for diabetes, action, purpose, timing of dose and side effects.    Monitoring  Purpose and frequency of SMBG.;Identified appropriate SMBG and/or A1C goals.    Acute complications  Taught treatment of hypoglycemia - the 15 rule.    Psychosocial adjustment  Role of stress on diabetes      Individualized Goals (developed by patient)   Nutrition  General guidelines for healthy choices and portions discussed    Physical Activity  Exercise 3-5 times per week    Monitoring   test my blood glucose as discussed      Outcomes   Expected Outcomes  Demonstrated interest in learning. Expect positive outcomes    Future DMSE  4-6 wks    Program Status  Completed     Individualized Plan for Diabetes Self-Management Training:   Learning Objective:  Patient will have a greater understanding of diabetes self-management. Patient education plan is to attend individual and/or group sessions per assessed needs and concerns.   Patient Instructions   Consider looking into other exercise options: arm chair exercises, swimming.  Consider investigating ways to help get more sleep. One way to help calm the mind is body scan meditation.  Consider checking blood sugar:  Daily - Fasting blood sugar  As needed - when you might have low blood sugar  1-3 times per week - before and 2 hours after lunch  Keep a blood sugar log or download an app to your phone to track your numbers.   Aim to eat balanced meals and snacks, including a protein whenever you have a carbohydrate  Website for Living Well with Diabetes book: http://diabetes.ada-ksw.com   Expected Outcomes:  Demonstrated interest in learning. Expect positive outcomes  Education material provided: A1C conversion sheet, My Plate and Carbohydrate  counting sheet, Sleep Hygiene, Hypo-Hyperglycemia symptom overlap  If problems or questions, patient to contact team via:  Phone and MyChart  Future DSME appointment: 4-6 wks

## 2017-08-31 NOTE — Patient Instructions (Addendum)
   Consider looking into other exercise options: arm chair exercises, swimming.  Consider investigating ways to help get more sleep. One way to help calm the mind is body scan meditation.  Consider checking blood sugar:  Daily - Fasting blood sugar  As needed - when you might have low blood sugar  1-3 times per week - before and 2 hours after lunch  Keep a blood sugar log or download an app to your phone to track your numbers.   Aim to eat balanced meals and snacks, including a protein whenever you have a carbohydrate  Website for Living Well with Diabetes book: http://diabetes.ada-ksw.com

## 2017-09-26 DIAGNOSIS — S86012A Strain of left Achilles tendon, initial encounter: Secondary | ICD-10-CM | POA: Diagnosis not present

## 2017-09-26 DIAGNOSIS — Z4789 Encounter for other orthopedic aftercare: Secondary | ICD-10-CM | POA: Diagnosis not present

## 2017-10-17 ENCOUNTER — Ambulatory Visit: Payer: Medicare Other | Admitting: Registered"

## 2017-11-01 ENCOUNTER — Ambulatory Visit: Payer: Medicare Other | Admitting: Registered"

## 2017-11-08 DIAGNOSIS — E119 Type 2 diabetes mellitus without complications: Secondary | ICD-10-CM | POA: Diagnosis not present

## 2017-11-18 ENCOUNTER — Ambulatory Visit: Payer: Medicare Other | Admitting: Registered"

## 2017-11-18 DIAGNOSIS — F419 Anxiety disorder, unspecified: Secondary | ICD-10-CM | POA: Diagnosis not present

## 2017-11-18 DIAGNOSIS — K30 Functional dyspepsia: Secondary | ICD-10-CM | POA: Diagnosis not present

## 2017-11-18 DIAGNOSIS — I10 Essential (primary) hypertension: Secondary | ICD-10-CM | POA: Diagnosis not present

## 2017-11-18 DIAGNOSIS — Z7984 Long term (current) use of oral hypoglycemic drugs: Secondary | ICD-10-CM | POA: Diagnosis not present

## 2017-11-18 DIAGNOSIS — E119 Type 2 diabetes mellitus without complications: Secondary | ICD-10-CM | POA: Diagnosis not present

## 2017-11-18 DIAGNOSIS — G479 Sleep disorder, unspecified: Secondary | ICD-10-CM | POA: Diagnosis not present

## 2017-11-18 DIAGNOSIS — E782 Mixed hyperlipidemia: Secondary | ICD-10-CM | POA: Diagnosis not present

## 2017-11-21 ENCOUNTER — Other Ambulatory Visit: Payer: Self-pay | Admitting: *Deleted

## 2017-11-21 DIAGNOSIS — I10 Essential (primary) hypertension: Secondary | ICD-10-CM | POA: Diagnosis not present

## 2017-11-21 DIAGNOSIS — E119 Type 2 diabetes mellitus without complications: Secondary | ICD-10-CM | POA: Diagnosis not present

## 2017-11-21 DIAGNOSIS — Z0000 Encounter for general adult medical examination without abnormal findings: Secondary | ICD-10-CM | POA: Diagnosis not present

## 2017-11-21 DIAGNOSIS — F419 Anxiety disorder, unspecified: Secondary | ICD-10-CM | POA: Diagnosis not present

## 2017-11-21 DIAGNOSIS — N529 Male erectile dysfunction, unspecified: Secondary | ICD-10-CM | POA: Diagnosis not present

## 2017-11-21 DIAGNOSIS — J45909 Unspecified asthma, uncomplicated: Secondary | ICD-10-CM | POA: Diagnosis not present

## 2017-11-21 DIAGNOSIS — E782 Mixed hyperlipidemia: Secondary | ICD-10-CM | POA: Diagnosis not present

## 2017-11-21 MED ORDER — NITROGLYCERIN 0.4 MG SL SUBL: SUBLINGUAL_TABLET | SUBLINGUAL | 0 refills | Status: DC

## 2017-12-06 ENCOUNTER — Ambulatory Visit: Payer: Medicare Other | Admitting: Registered"

## 2018-01-16 DIAGNOSIS — E0800 Diabetes mellitus due to underlying condition with hyperosmolarity without nonketotic hyperglycemic-hyperosmolar coma (NKHHC): Secondary | ICD-10-CM | POA: Diagnosis not present

## 2018-01-16 DIAGNOSIS — N411 Chronic prostatitis: Secondary | ICD-10-CM | POA: Diagnosis not present

## 2018-01-16 DIAGNOSIS — N528 Other male erectile dysfunction: Secondary | ICD-10-CM | POA: Diagnosis not present

## 2018-01-16 DIAGNOSIS — N138 Other obstructive and reflux uropathy: Secondary | ICD-10-CM | POA: Diagnosis not present

## 2018-01-16 DIAGNOSIS — N401 Enlarged prostate with lower urinary tract symptoms: Secondary | ICD-10-CM | POA: Diagnosis not present

## 2018-01-16 DIAGNOSIS — N35014 Post-traumatic urethral stricture, male, unspecified: Secondary | ICD-10-CM | POA: Diagnosis not present

## 2018-02-02 DIAGNOSIS — N529 Male erectile dysfunction, unspecified: Secondary | ICD-10-CM | POA: Diagnosis not present

## 2018-02-02 DIAGNOSIS — N138 Other obstructive and reflux uropathy: Secondary | ICD-10-CM | POA: Diagnosis not present

## 2018-02-02 DIAGNOSIS — N401 Enlarged prostate with lower urinary tract symptoms: Secondary | ICD-10-CM | POA: Diagnosis not present

## 2018-02-22 DIAGNOSIS — Z23 Encounter for immunization: Secondary | ICD-10-CM | POA: Diagnosis not present

## 2018-03-03 DIAGNOSIS — N401 Enlarged prostate with lower urinary tract symptoms: Secondary | ICD-10-CM | POA: Diagnosis not present

## 2018-03-03 DIAGNOSIS — N411 Chronic prostatitis: Secondary | ICD-10-CM | POA: Diagnosis not present

## 2018-03-03 DIAGNOSIS — N35014 Post-traumatic urethral stricture, male, unspecified: Secondary | ICD-10-CM | POA: Diagnosis not present

## 2018-03-03 DIAGNOSIS — N138 Other obstructive and reflux uropathy: Secondary | ICD-10-CM | POA: Diagnosis not present

## 2018-05-04 DIAGNOSIS — F430 Acute stress reaction: Secondary | ICD-10-CM | POA: Diagnosis not present

## 2018-05-04 DIAGNOSIS — G479 Sleep disorder, unspecified: Secondary | ICD-10-CM | POA: Diagnosis not present

## 2018-05-22 ENCOUNTER — Encounter: Payer: Self-pay | Admitting: Interventional Cardiology

## 2018-05-24 DIAGNOSIS — N529 Male erectile dysfunction, unspecified: Secondary | ICD-10-CM | POA: Diagnosis not present

## 2018-05-24 DIAGNOSIS — L570 Actinic keratosis: Secondary | ICD-10-CM | POA: Diagnosis not present

## 2018-05-24 DIAGNOSIS — E1169 Type 2 diabetes mellitus with other specified complication: Secondary | ICD-10-CM | POA: Diagnosis not present

## 2018-05-24 DIAGNOSIS — F419 Anxiety disorder, unspecified: Secondary | ICD-10-CM | POA: Diagnosis not present

## 2018-05-24 DIAGNOSIS — I2510 Atherosclerotic heart disease of native coronary artery without angina pectoris: Secondary | ICD-10-CM | POA: Diagnosis not present

## 2018-05-30 NOTE — Progress Notes (Signed)
Cardiology Office Note   Date:  05/31/2018   ID:  Jon Sanchez 1941/04/17, MRN [id number]  PCP:  Lawerance Cruel, MD    No chief complaint on file.  CAD  Wt Readings from Last 3 Encounters:  05/31/18 148 lb 6.4 oz (67.3 kg)  04/13/17 145 lb (65.8 kg)  04/13/16 155 lb (70.3 kg)       History of Present Illness: Jon Sanchez is a 77 y.o. male   who has had a DES to the LAD in 5/07. He has hearing loss and tinnitus in the right ear.  Diagnosed with DM.  He lost weight and was started on metformin.  This was stopped later.  He is now retired  He had an achilles tendon problem.  He had surgery in 9/18 at [facility].  Denies : Chest pain. Dizziness. Leg edema. Nitroglycerin use. Orthopnea. Palpitations. Paroxysmal nocturnal dyspnea. Shortness of breath. Syncope.   He joined a gym.    No bleeding problems. Bruising easily.  Eating healthy.    Clonidine added at night to help with sleep. No high BP readings.   He is fully retired.  Past Medical History:  Diagnosis Date  . Actinic keratoses   . Allergic rhinitis   . Anxiety   . Benign essential tremor   . CAD (coronary artery disease)   . Chest pain   . Diabetes ([place])   . GERD (gastroesophageal reflux disease)   . Hyperlipidemia   . Myocardial infarct ([place])   . Osteoarthrosis, unspecified whether generalized or localized, hand   . Pain in joint     Past Surgical History:  Procedure Laterality Date  . CORONARY ANGIOPLASTY  2007     Current Outpatient Medications  Medication Sig Dispense Refill  . atorvastatin (LIPITOR) 20 MG tablet TAKE 1 TABLET(20 MG) BY MOUTH DAILY 90 tablet 2  . B Complex Vitamins (VITAMIN B COMPLEX PO) Take 1 tablet by mouth daily.    . cloNIDine (CATAPRES) 0.1 MG tablet Take 0.1 mg by mouth 2 (two) times daily.    . clopidogrel (PLAVIX) 75 MG tablet TAKE 1 TABLET BY MOUTH DAILY 30 tablet 11  . LORazepam (ATIVAN) 0.5 MG tablet Take 0.5 mg by mouth every 4 (four) hours  as needed for anxiety.     Marland Kitchen losartan (COZAAR) 25 MG tablet TAKE 1 TABLET (25 MG TOTAL) BY MOUTH DAILY. 90 tablet 2  . metFORMIN (GLUCOPHAGE) 500 MG tablet Take 500 mg by mouth daily with breakfast.     . Multiple Vitamin (MULTI-VITAMINS) TABS Take 1 tablet by mouth daily.     . nitroGLYCERIN (NITROSTAT) 0.4 MG SL tablet DISSOLVE 1 TABLET UNDER THE TONGUE EVERY 5 MINUTES AS NEEDED FOR CHEST PAIN 75 tablet 0  . Omega-3 Fatty Acids (FISH OIL PO) Take 1 tablet by mouth daily.    Marland Kitchen PROAIR HFA 108 (90 BASE) MCG/ACT inhaler Inhale 2 puffs into the lungs every 6 (six) hours as needed (WHEEZING).     Marland Kitchen propranolol ER (INDERAL LA) 60 MG 24 hr capsule Take 60 mg by mouth daily.     No current facility-administered medications for this visit.     Allergies:   Codeine    Social History:  The patient  reports that he has never smoked. He has never used smokeless tobacco. He reports that he does not drink alcohol or use drugs.   Family History:  The patient's family history includes Colon cancer in his mother; Coronary  artery disease in his unknown relative; Heart attack in his father; Hypertension in his mother; Lung cancer in his father.    ROS:  Please see the history of present illness.   Otherwise, review of systems are positive for foot problems- improved.   All other systems are reviewed and negative.    PHYSICAL EXAM: VS:  BP 108/64   Pulse 66   Ht 5\' 7"  (1.702 m)   Wt 148 lb 6.4 oz (67.3 kg)   SpO2 97%   BMI 23.24 kg/m  , BMI Body mass index is 23.24 kg/m. GEN: Well nourished, well developed, in no acute distress  HEENT: normal  Neck: no JVD, carotid bruits, or masses Cardiac: RRR; no murmurs, rubs, or gallops,no edema  Respiratory:  clear to auscultation bilaterally, normal work of breathing GI: soft, nontender, nondistended, + BS; thin; no pulsatile mass MS: no deformity or atrophy  Skin: warm and dry, no rash Neuro:  Strength and sensation are intact Psych: euthymic mood, full  affect   EKG:   The ekg ordered today demonstrates NSR, no ST segment changes   Recent Labs: No results found for requested labs within last 8760 hours.   Lipid Panel    Component Value Date/Time   CHOL 106 10/11/2016 1443   CHOL 99 07/27/2013 0907   TRIG 159 (H) 10/11/2016 1443   TRIG 102 07/27/2013 0907   HDL 31 (L) 10/11/2016 1443   HDL 35 (L) 07/27/2013 0907   CHOLHDL 3.4 10/11/2016 1443   CHOLHDL 3.2 09/01/2015 0845   VLDL 22 09/01/2015 0845   LDLCALC 43 10/11/2016 1443   LDLCALC 44 07/27/2013 0907     Other studies Reviewed: Additional studies/ records that were reviewed today with results demonstrating: labs reviewed.   ASSESSMENT AND PLAN:  1. CAD/Old MI: No angina.  Continue aggressive medical therapy.  Continue regular exercise.  No CHF. 2. DM: A1C 5.7 in 12/19.  COntinue metformin.  3. Hyperlipidemia: The current medical regimen is effective;  continue present plan and medications. 4. HTN: The current medical regimen is effective;  continue present plan and medications.    Current medicines are reviewed at length with the patient today.  The patient concerns regarding his medicines were addressed.  The following changes have been made:  No change  Labs/ tests ordered today include:  No orders of the defined types were placed in this encounter.   Recommend 150 minutes/week of aerobic exercise Low fat, low carb, high fiber diet recommended  Disposition:   FU in 1 year   Signed, Larae Grooms, MD  05/31/2018 10:36 AM    [facility] [address], [place], [place]  [address] Phone: [phone]; Fax: [phone]

## 2018-05-31 ENCOUNTER — Encounter: Payer: Self-pay | Admitting: Interventional Cardiology

## 2018-05-31 ENCOUNTER — Ambulatory Visit (INDEPENDENT_AMBULATORY_CARE_PROVIDER_SITE_OTHER): Payer: Medicare Other | Admitting: Interventional Cardiology

## 2018-05-31 VITALS — BP 108/64 | HR 66 | Ht 67.0 in | Wt 148.4 lb

## 2018-05-31 DIAGNOSIS — E782 Mixed hyperlipidemia: Secondary | ICD-10-CM | POA: Diagnosis not present

## 2018-05-31 DIAGNOSIS — I252 Old myocardial infarction: Secondary | ICD-10-CM

## 2018-05-31 DIAGNOSIS — I10 Essential (primary) hypertension: Secondary | ICD-10-CM

## 2018-05-31 DIAGNOSIS — E1159 Type 2 diabetes mellitus with other circulatory complications: Secondary | ICD-10-CM | POA: Diagnosis not present

## 2018-05-31 DIAGNOSIS — I25119 Atherosclerotic heart disease of native coronary artery with unspecified angina pectoris: Secondary | ICD-10-CM

## 2018-05-31 MED ORDER — CLOPIDOGREL BISULFATE 75 MG PO TABS: 75.0000 mg | ORAL_TABLET | Freq: Every day | ORAL | 3 refills | Status: DC

## 2018-05-31 NOTE — Patient Instructions (Signed)

## 2018-06-18 DIAGNOSIS — J209 Acute bronchitis, unspecified: Secondary | ICD-10-CM | POA: Diagnosis not present

## 2018-06-18 DIAGNOSIS — R05 Cough: Secondary | ICD-10-CM | POA: Diagnosis not present

## 2018-06-26 ENCOUNTER — Telehealth: Payer: Self-pay | Admitting: Interventional Cardiology

## 2018-06-26 NOTE — Telephone Encounter (Signed)
New message     [facility] Pre-operative Risk Assessment    Request for surgical clearance:  1. What type of surgery is being performed? Colonscopy   2. When is this surgery scheduled? 08/23/18 at 1115  3. What type of clearance is required (medical clearance vs. Pharmacy clearance to hold med vs. Both)? Pharmacy to hold plavix   4. Are there any medications that need to be held prior to surgery and how long? plavix   5. Practice name and name of physician performing surgery? Dr. Darlis Loan   6. What is your office phone number ? [phone]   7.   What is your office fax number  8.   Anesthesia type (None, local, MAC, general) ?    Einar Grad Penn 06/26/2018, 4:07 PM  _________________________________________________________________   (provider comments below)

## 2018-06-30 NOTE — Telephone Encounter (Addendum)
   Primary Cardiologist: Larae Grooms, MD  Chart reviewed as part of pre-operative protocol coverage. Patient was contacted 06/30/2018 in reference to pre-operative risk assessment for pending surgery as outlined below.  Jon Sanchez was last seen on 05/31/18 by Dr. Irish Lack. H/o CAD s/p DES to LAD 2007 on Plavix monotherapy, DM, HLD, tremor. Last OV 05/31/18 - felt to be doing well.   In 2017 Dr. Irish Lack did clear patient to stop Plavix for 5 days for colonoscopy. Given no interim cardiac procedures, the same principle would apply. Would advise to resume as soon as felt safe by performing physician. I called patient to make sure no concerns since last OV but got voicemail, [facility] for him to call us back. If patient denies any significant change since last OV, will be OK to clear.  Addendum: patient called back this morning and affirms he is feeling great without any angina, dyspnea or new cardiac symptoms. He reports no interventions since 2007. Therefore, OK to proceed with procedure with antiplatelet recommendation above. If for some reason patient is to remain off Plavix for longer duration would consider addition of low dose ASA in the meantime.  Will route this bundled recommendation to requesting provider via Epic fax function. Please call with questions.  Charlie Pitter, PA-C 06/30/2018, 8:42 AM

## 2018-07-04 NOTE — Telephone Encounter (Signed)
Patient is returning call.  °

## 2019-02-26 ENCOUNTER — Emergency Department (HOSPITAL_COMMUNITY)
Admission: EM | Admit: 2019-02-26 | Discharge: 2019-02-26 | Disposition: A | Discharge status: Home / Self Care | Payer: Medicare Other | Attending: Emergency Medicine | Admitting: Emergency Medicine

## 2019-02-26 ENCOUNTER — Other Ambulatory Visit: Payer: Self-pay

## 2019-02-26 ENCOUNTER — Ambulatory Visit (HOSPITAL_COMMUNITY)
Admission: EM | Admit: 2019-02-26 | Discharge: 2019-02-26 | Disposition: A | Discharge status: Home / Self Care | Payer: Medicare Other | Source: Home / Self Care

## 2019-02-26 ENCOUNTER — Encounter (HOSPITAL_COMMUNITY): Payer: Self-pay

## 2019-02-26 ENCOUNTER — Emergency Department (HOSPITAL_COMMUNITY): Payer: Medicare Other

## 2019-02-26 DIAGNOSIS — W010XXA Fall on same level from slipping, tripping and stumbling without subsequent striking against object, initial encounter: Secondary | ICD-10-CM | POA: Diagnosis not present

## 2019-02-26 DIAGNOSIS — I10 Essential (primary) hypertension: Secondary | ICD-10-CM | POA: Diagnosis not present

## 2019-02-26 DIAGNOSIS — W19XXXA Unspecified fall, initial encounter: Secondary | ICD-10-CM

## 2019-02-26 DIAGNOSIS — I259 Chronic ischemic heart disease, unspecified: Secondary | ICD-10-CM | POA: Insufficient documentation

## 2019-02-26 DIAGNOSIS — S80211A Abrasion, right knee, initial encounter: Secondary | ICD-10-CM | POA: Diagnosis not present

## 2019-02-26 DIAGNOSIS — S01511A Laceration without foreign body of lip, initial encounter: Secondary | ICD-10-CM | POA: Diagnosis not present

## 2019-02-26 DIAGNOSIS — Y92480 Sidewalk as the place of occurrence of the external cause: Secondary | ICD-10-CM | POA: Diagnosis not present

## 2019-02-26 DIAGNOSIS — E119 Type 2 diabetes mellitus without complications: Secondary | ICD-10-CM | POA: Insufficient documentation

## 2019-02-26 DIAGNOSIS — Z7984 Long term (current) use of oral hypoglycemic drugs: Secondary | ICD-10-CM | POA: Insufficient documentation

## 2019-02-26 DIAGNOSIS — Z23 Encounter for immunization: Secondary | ICD-10-CM | POA: Diagnosis not present

## 2019-02-26 DIAGNOSIS — Y999 Unspecified external cause status: Secondary | ICD-10-CM | POA: Diagnosis not present

## 2019-02-26 DIAGNOSIS — Y93K1 Activity, walking an animal: Secondary | ICD-10-CM | POA: Insufficient documentation

## 2019-02-26 DIAGNOSIS — Z79899 Other long term (current) drug therapy: Secondary | ICD-10-CM | POA: Diagnosis not present

## 2019-02-26 DIAGNOSIS — T148XXA Other injury of unspecified body region, initial encounter: Secondary | ICD-10-CM

## 2019-02-26 DIAGNOSIS — S0993XA Unspecified injury of face, initial encounter: Secondary | ICD-10-CM | POA: Diagnosis present

## 2019-02-26 DIAGNOSIS — Y92009 Unspecified place in unspecified non-institutional (private) residence as the place of occurrence of the external cause: Secondary | ICD-10-CM

## 2019-02-26 DIAGNOSIS — Z7902 Long term (current) use of antithrombotics/antiplatelets: Secondary | ICD-10-CM | POA: Insufficient documentation

## 2019-02-26 MED ORDER — TETANUS-DIPHTH-ACELL PERTUSSIS 5-2.5-18.5 LF-MCG/0.5 IM SUSP
0.5000 mL | Freq: Once | INTRAMUSCULAR | Status: AC
  Administered 2019-02-26: 0.5 mL via INTRAMUSCULAR
  Filled 2019-02-26: qty 0.5

## 2019-02-26 NOTE — ED Triage Notes (Signed)
Pt endorses tripping over a wire 2 hours ago on cement and landed face forward, pt has abrasion above right side of mouth. No loc, no dizziness. PERRL. Pt is on plavix. VSS.

## 2019-02-26 NOTE — ED Provider Notes (Signed)
[facility] EMERGENCY DEPARTMENT Provider Note   CSN: [id number] Arrival date & time: 02/26/19  1013     History   Chief Complaint Chief Complaint  Patient presents with   Fall    HPI [name] is a 78 y.o. male.     The history is provided by the patient.  Fall This is a new problem. The current episode started 1 to 2 hours ago. The problem occurs constantly. The problem has been resolved. Associated symptoms comments: Pain in the right upper lip and teeth.  No LOC prior to or after the event.  No neck or head pain.  Small scratch on the right knee but able to walk without difficulty.  No pain with mouth opening.. Nothing aggravates the symptoms. Nothing relieves the symptoms. He has tried nothing for the symptoms. The treatment provided no relief.    Past Medical History:  Diagnosis Date   Actinic keratoses    Allergic rhinitis    Anxiety    Benign essential tremor    CAD (coronary artery disease)    Chest pain    Diabetes (HCC)    GERD (gastroesophageal reflux disease)    Hyperlipidemia    Myocardial infarct ([facility])    Osteoarthrosis, unspecified whether generalized or localized, hand    Pain in joint     Patient Active Problem List   Diagnosis Date Noted   Diabetes mellitus ([place]) 04/14/2015   Essential hypertension, benign 04/13/2013   Mixed hyperlipidemia 04/13/2013   Old myocardial infarction 04/13/2013   Coronary atherosclerosis 10/11/2008   GERD 10/11/2008   CHEST PAIN-UNSPECIFIED 10/11/2008    Past Surgical History:  Procedure Laterality Date   CORONARY ANGIOPLASTY  2007        Home Medications    Prior to Admission medications   Medication Sig Start Date End Date Taking? Authorizing Provider  atorvastatin (LIPITOR) 20 MG tablet TAKE 1 TABLET(20 MG) BY MOUTH DAILY 06/27/17   Jettie Booze, MD  B Complex Vitamins (VITAMIN B COMPLEX PO) Take 1 tablet by mouth daily.    [provider]    cloNIDine (CATAPRES) 0.1 MG tablet Take 0.1 mg by mouth 2 (two) times daily.    [provider]  clopidogrel (PLAVIX) 75 MG tablet Take 1 tablet (75 mg total) by mouth daily. 05/31/18   Jettie Booze, MD  LORazepam (ATIVAN) 0.5 MG tablet Take 0.5 mg by mouth every 4 (four) hours as needed for anxiety.     [provider]  losartan (COZAAR) 25 MG tablet TAKE 1 TABLET (25 MG TOTAL) BY MOUTH DAILY. 07/02/16   Jettie Booze, MD  metFORMIN (GLUCOPHAGE) 500 MG tablet Take 500 mg by mouth daily with breakfast.     [provider]  Multiple Vitamin (MULTI-VITAMINS) TABS Take 1 tablet by mouth daily.     [provider]  nitroGLYCERIN (NITROSTAT) 0.4 MG SL tablet DISSOLVE 1 TABLET UNDER THE TONGUE EVERY 5 MINUTES AS NEEDED FOR CHEST PAIN 11/21/17   Jettie Booze, MD  Omega-3 Fatty Acids (FISH OIL PO) Take 1 tablet by mouth daily.    [provider]  PROAIR HFA 108 (90 BASE) MCG/ACT inhaler Inhale 2 puffs into the lungs every 6 (six) hours as needed (WHEEZING).  01/26/14   [provider]  propranolol ER (INDERAL LA) 60 MG 24 hr capsule Take 60 mg by mouth daily. 03/17/14   [provider]    Family History Family History  Problem Relation  Age of Onset   Colon cancer Mother    Hypertension Mother    Heart attack Father    Lung cancer Father    Coronary artery disease Other        family hx    Stroke Neg Hx     Social History Social History   Tobacco Use   Smoking status: Never Smoker   Smokeless tobacco: Never Used   Tobacco comment: tobacco use - no   Substance Use Topics   Alcohol use: No   Drug use: No     Allergies   Codeine   Review of Systems Review of Systems  All other systems reviewed and are negative.    Physical Exam Updated Vital Signs BP 134/63 (BP Location: Right Arm)    Pulse 65    Temp 98.4 F (36.9 C) (Oral)    Resp 20    SpO2 99%   Physical Exam Vitals signs and  nursing note reviewed.  Constitutional:      General: He is not in acute distress.    Appearance: He is well-developed and normal weight.  HENT:     Head: Normocephalic and atraumatic.      Mouth/Throat:     Dentition: Dental tenderness present.     Tongue: No lesions.     Pharynx: Oropharynx is clear.   Eyes:     Conjunctiva/sclera: Conjunctivae normal.     Pupils: Pupils are equal, round, and reactive to light.  Neck:     Musculoskeletal: Normal range of motion and neck supple.  Cardiovascular:     Rate and Rhythm: Normal rate and regular rhythm.     Heart sounds: No murmur.  Pulmonary:     Effort: Pulmonary effort is normal. No respiratory distress.     Breath sounds: Normal breath sounds. No wheezing or rales.  Abdominal:     General: There is no distension.     Palpations: Abdomen is soft.     Tenderness: There is no abdominal tenderness. There is no guarding or rebound.  Musculoskeletal: Normal range of motion.     Right knee: He exhibits normal range of motion and no swelling. No tenderness found. No medial joint line and no lateral joint line tenderness noted.       Legs:  Skin:    General: Skin is warm and dry.     Findings: No erythema or rash.  Neurological:     General: No focal deficit present.     Mental Status: He is alert and oriented to person, place, and time. Mental status is at baseline.  Psychiatric:        Mood and Affect: Mood normal.        Behavior: Behavior normal.        Thought Content: Thought content normal.      ED Treatments / Results  Labs (all labs ordered are listed, but only abnormal results are displayed) Labs Reviewed - No data to display  EKG None  Radiology Ct Maxillofacial Wo Contrast  Result Date: 02/26/2019 CLINICAL DATA:  [place], hit face, right jaw pain EXAM: CT MAXILLOFACIAL WITHOUT CONTRAST TECHNIQUE: Multidetector CT imaging of the maxillofacial structures was performed. Multiplanar CT image reconstructions were also  generated. COMPARISON:  None. FINDINGS: Osseous: No fracture or mandibular dislocation. No destructive process. Orbits: Negative. No traumatic or inflammatory finding. Sinuses: Clear. Soft tissues: Negative. Limited intracranial: No significant or unexpected finding. IMPRESSION: No evidence for acute abnormality. Electronically Signed   By: Benjamine Mola  Owens Shark M.D.   On: 02/26/2019 11:15    Procedures Procedures (including critical care time)  Medications Ordered in ED Medications  Tdap (BOOSTRIX) injection 0.5 mL (has no administration in time range)     Initial Impression / Assessment and Plan / ED Course  I have reviewed the triage vital signs and the nursing notes.  Pertinent labs & imaging results that were available during my care of the patient were reviewed by me and considered in my medical decision making (see chart for details).        78 year old male on Plavix for coronary artery disease presenting today after he tripped over a wire while he was walking his dog and fell face first onto the cement.  He states he hit the right side of his lip but denies any head injury or loss of consciousness.  He is complaining of dental pain and has a laceration on the inside of the lip but no other significant injury.  Patient's maxillofacial CT is negative.  Patient has no evidence of injury to the head and cervical tenderness.  He is otherwise well-appearing and has been able to ambulate without difficulty.  Tetanus shot was updated, laceration should heal by secondary intention and there is no evidence of significant dental trauma at this time.  Patient was encouraged to take Tylenol as needed for pain.  Final Clinical Impressions(s) / ED Diagnoses   Final diagnoses:  Abrasion  Lip laceration, initial encounter  Fall in home, initial encounter    ED Discharge Orders    None       Blanchie Dessert, MD 02/26/19 1143

## 2019-02-26 NOTE — ED Notes (Signed)
Spoke to patient in lobby.  Spoke to traci, np about patient.  Recommended patient to ED.  Patient is agreeable, spouse with patient.  Patient tripped and fell this morning.  Swelling, blood to upper lip.  Patient says he feels like his front tooth has been displaced from normal position.  Abrasion to chin and pain.  Nose bled.  Patient is on blood thinners.

## 2019-02-26 NOTE — Discharge Instructions (Signed)
Use ice for swelling of the lip and can put vasoline over the scrape.  Eat a soft diet over the next 2-3 days while cut is healing.  Avoid hot, spicy or foods with sharp pieces.

## 2019-06-06 ENCOUNTER — Other Ambulatory Visit: Payer: Self-pay

## 2019-06-06 ENCOUNTER — Ambulatory Visit: Payer: Medicare Other | Admitting: Interventional Cardiology

## 2019-06-06 ENCOUNTER — Encounter: Payer: Self-pay | Admitting: Interventional Cardiology

## 2019-06-06 VITALS — BP 100/52 | HR 75 | Ht 67.0 in | Wt 149.4 lb

## 2019-06-06 DIAGNOSIS — I252 Old myocardial infarction: Secondary | ICD-10-CM

## 2019-06-06 DIAGNOSIS — I10 Essential (primary) hypertension: Secondary | ICD-10-CM | POA: Diagnosis not present

## 2019-06-06 DIAGNOSIS — I25119 Atherosclerotic heart disease of native coronary artery with unspecified angina pectoris: Secondary | ICD-10-CM | POA: Diagnosis not present

## 2019-06-06 DIAGNOSIS — E1159 Type 2 diabetes mellitus with other circulatory complications: Secondary | ICD-10-CM

## 2019-06-06 DIAGNOSIS — E782 Mixed hyperlipidemia: Secondary | ICD-10-CM

## 2019-06-06 MED ORDER — ATORVASTATIN CALCIUM 20 MG PO TABS: 20.0000 mg | ORAL_TABLET | Freq: Every day | ORAL | 3 refills | Status: AC

## 2019-06-06 NOTE — Progress Notes (Signed)
Cardiology Office Note   Date:  06/06/2019   ID:  Jon Sanchez, Jon Sanchez 1940/10/05, MRN [id number]  PCP:  Lawerance Cruel, MD    No chief complaint on file.  CAD  Wt Readings from Last 3 Encounters:  06/06/19 149 lb 6.4 oz (67.8 kg)  05/31/18 148 lb 6.4 oz (67.3 kg)  04/13/17 145 lb (65.8 kg)       History of Present Illness: Jon Sanchez is a 78 y.o. male  who has had a DES to the LAD in 5/07. He has hearing loss and tinnitus in the right ear.  Diagnosed with DM.He lost weight and was started on metformin.This was stopped later. He is now retired  He had an achilles tendon problem. He had surgery in 9/[facility] at [facility].  He joined a gym.    No bleeding problems. Bruising easily.  Eating healthy.    Clonidine added at night to help with sleep. No high BP readings.   He is fully retired.  Had achilles surgery in 2018.    Since the last visit, he has done well.   Denies : Chest pain. Dizziness. Leg edema. Nitroglycerin use. Orthopnea. Palpitations. Paroxysmal nocturnal dyspnea. Shortness of breath. Syncope.   BP in the 120-130 range at home.     Past Medical History:  Diagnosis Date  . Actinic keratoses   . Allergic rhinitis   . Anxiety   . Benign essential tremor   . CAD (coronary artery disease)   . Chest pain   . Diabetes ([place])   . GERD (gastroesophageal reflux disease)   . Hyperlipidemia   . Myocardial infarct ([place])   . Osteoarthrosis, unspecified whether generalized or localized, hand   . Pain in joint     Past Surgical History:  Procedure Laterality Date  . CORONARY ANGIOPLASTY  2007     Current Outpatient Medications  Medication Sig Dispense Refill  . atorvastatin (LIPITOR) 10 MG tablet Take 10 mg by mouth daily.    . B Complex Vitamins (VITAMIN B COMPLEX PO) Take 1 tablet by mouth daily.    . clopidogrel (PLAVIX) 75 MG tablet Take 1 tablet (75 mg total) by mouth daily. 90 tablet 3  . LORazepam (ATIVAN) 0.5 MG tablet  Take 0.5 mg by mouth every 4 (four) hours as needed for anxiety.     Marland Kitchen losartan (COZAAR) 25 MG tablet TAKE 1 TABLET (25 MG TOTAL) BY MOUTH DAILY. 90 tablet 2  . metFORMIN (GLUCOPHAGE) 500 MG tablet Take 500 mg by mouth daily with breakfast.     . Multiple Vitamin (MULTI-VITAMINS) TABS Take 1 tablet by mouth daily.     . nitroGLYCERIN (NITROSTAT) 0.4 MG SL tablet DISSOLVE 1 TABLET UNDER THE TONGUE EVERY 5 MINUTES AS NEEDED FOR CHEST PAIN 75 tablet 0  . Omega-3 Fatty Acids (FISH OIL PO) Take 1 tablet by mouth daily.    Marland Kitchen PROAIR HFA 108 (90 BASE) MCG/ACT inhaler Inhale 2 puffs into the lungs every 6 (six) hours as needed (WHEEZING).     Marland Kitchen propranolol ER (INDERAL LA) 60 MG 24 hr capsule Take 60 mg by mouth daily.     No current facility-administered medications for this visit.    Allergies:   Codeine    Social History:  The patient  reports that he has never smoked. He has never used smokeless tobacco. He reports that he does not drink alcohol or use drugs.   Family History:  The patient's family history includes  Colon cancer in his mother; Coronary artery disease in an other family member; Heart attack in his father; Hypertension in his mother; Lung cancer in his father.    ROS:  Please see the history of present illness.   Otherwise, review of systems are positive for hearing issues.   All other systems are reviewed and negative.    PHYSICAL EXAM: VS:  BP (!) 100/52   Pulse 75   Ht 5\' 7"  (1.702 m)   Wt 149 lb 6.4 oz (67.8 kg)   SpO2 97%   BMI 23.40 kg/m  , BMI Body mass index is 23.4 kg/m. GEN: Well nourished, well developed, in no acute distress  HEENT: normal  Neck: no JVD, carotid bruits, or masses Cardiac: RRR; no murmurs, rubs, or gallops,no edema  Respiratory:  clear to auscultation bilaterally, normal work of breathing GI: soft, nontender, nondistended, + BS MS: no deformity or atrophy  Skin: warm and dry, no rash Neuro:  Strength and sensation are intact Psych: euthymic  mood, full affect   EKG:   The ekg ordered today demonstrates NSR, no ST changes   Recent Labs: No results found for requested labs within last 8760 hours.   Lipid Panel    Component Value Date/Time   CHOL 106 10/11/2016 1443   CHOL 99 07/27/2013 0907   TRIG 159 (H) 10/11/2016 1443   TRIG 102 07/27/2013 0907   HDL 31 (L) 10/11/2016 1443   HDL 35 (L) 07/27/2013 0907   CHOLHDL 3.4 10/11/2016 1443   CHOLHDL 3.2 09/01/2015 0845   VLDL 22 09/01/2015 0845   LDLCALC 43 10/11/2016 1443   LDLCALC 44 07/27/2013 0907     Other studies Reviewed: Additional studies/ records that were reviewed today with results demonstrating: labs reviewed.   ASSESSMENT AND PLAN:  1. CAD/Old MI: No angina.  Continue aggressive secondary prevention.  2. DM: A1C 6.1 in 11/2018.  Healthy diet.  3. Hyperlipidemia: LDL 48 in 2020.  Go back to atorvastatin 20 mg daily given h/o CAD.   4. HTN: The current medical regimen is effective;  continue present plan and medications.   Current medicines are reviewed at length with the patient today.  The patient concerns regarding his medicines were addressed.  The following changes have been made:  No change  Labs/ tests ordered today include:  No orders of the defined types were placed in this encounter.   Recommend 150 minutes/week of aerobic exercise Low fat, low carb, high fiber diet recommended  Disposition:   FU in 1 year   Signed, Larae Grooms, MD  06/06/2019 3:31 PM    [facility] [place], [place],  [place]  [address] Phone: [phone]; Fax: [phone]

## 2019-06-06 NOTE — Patient Instructions (Signed)
Medication Instructions:  Your physician has recommended you make the following change in your medication:   INCREASE: atorvastatin (lipitor) to 20 mg once a day  *If you need a refill on your cardiac medications before your next appointment, please call your pharmacy*  Lab Work: None ordered  If you have labs (blood work) drawn today and your tests are completely normal, you will receive your results only by: Marland Kitchen MyChart Message (if you have MyChart) OR . A paper copy in the mail If you have any lab test that is abnormal or we need to change your treatment, we will call you to review the results.  Testing/Procedures: None ordered  Follow-Up: At [facility], you and your health needs are our priority.  As part of our continuing mission to provide you with exceptional heart care, we have created designated Provider Care Teams.  These Care Teams include your primary Cardiologist (physician) and Advanced Practice Providers (APPs -  Physician Assistants and Nurse Practitioners) who all work together to provide you with the care you need, when you need it.  Your next appointment:   12 month(s)  The format for your next appointment:   In Person  Provider:   You may see Larae Grooms, MD or one of the following Advanced Practice Providers on your designated Care Team:    Melina Copa, PA-C  Ermalinda Barrios, PA-C   Other Instructions

## 2019-06-18 ENCOUNTER — Other Ambulatory Visit: Payer: Self-pay | Admitting: Interventional Cardiology

## 2019-06-28 DIAGNOSIS — H9313 Tinnitus, bilateral: Secondary | ICD-10-CM | POA: Insufficient documentation

## 2019-06-28 DIAGNOSIS — H903 Sensorineural hearing loss, bilateral: Secondary | ICD-10-CM | POA: Insufficient documentation

## 2019-07-19 ENCOUNTER — Other Ambulatory Visit: Payer: Self-pay | Admitting: Interventional Cardiology

## 2019-07-19 MED ORDER — NITROGLYCERIN 0.4 MG SL SUBL: SUBLINGUAL_TABLET | SUBLINGUAL | 2 refills | Status: DC

## 2019-07-19 NOTE — Telephone Encounter (Signed)
Pt's medication was sent to pt's pharmacy as requested. Confirmation received.  °

## 2019-07-19 NOTE — Addendum Note (Signed)
Addended by: Derl Barrow on: 07/19/2019 10:37 AM   Modules accepted: Orders

## 2019-08-03 ENCOUNTER — Ambulatory Visit: Payer: Medicare Other | Attending: Internal Medicine

## 2019-08-03 DIAGNOSIS — Z23 Encounter for immunization: Secondary | ICD-10-CM | POA: Insufficient documentation

## 2019-08-03 NOTE — Progress Notes (Signed)
   Covid-19 Vaccination Clinic  Name:  Jon Sanchez    MRN: [id number] DOB: 10/02/1940  08/03/2019  [name] was observed post Covid-19 immunization for 30 minutes based on pre-vaccination screening without incidence. He was provided with Vaccine Information Sheet and instruction to access the V-Safe system.   [name] was instructed to call 911 with any severe reactions post vaccine: Marland Kitchen Difficulty breathing  . Swelling of your face and throat  . A fast heartbeat  . A bad rash all over your body  . Dizziness and weakness    Immunizations Administered    Name Date Dose VIS Date Route   Pfizer COVID-19 Vaccine 08/03/2019  5:37 PM 0.3 mL 06/01/2019 Intramuscular   Manufacturer: [place]   Lot: [id number]   [place]: [id number]

## 2019-08-26 ENCOUNTER — Ambulatory Visit: Payer: Medicare Other | Attending: Internal Medicine

## 2019-08-26 DIAGNOSIS — Z23 Encounter for immunization: Secondary | ICD-10-CM | POA: Insufficient documentation

## 2019-08-26 NOTE — Progress Notes (Signed)
   Covid-19 Vaccination Clinic  Name:  Jon Sanchez    MRN: [id number] DOB: August 15, 1940  08/26/2019  [name] was observed post Covid-19 immunization for 15 minutes without incident. He was provided with Vaccine Information Sheet and instruction to access the V-Safe system.   [name] was instructed to call 911 with any severe reactions post vaccine: Marland Kitchen Difficulty breathing  . Swelling of face and throat  . A fast heartbeat  . A bad rash all over body  . Dizziness and weakness   Immunizations Administered    Name Date Dose VIS Date Route   Pfizer COVID-19 Vaccine 08/26/2019  2:44 PM 0.3 mL 06/01/2019 Intramuscular   Manufacturer: [place]   Lot: [id number]   [place]: [id number]

## 2019-12-26 ENCOUNTER — Encounter: Payer: Self-pay | Admitting: Podiatry

## 2019-12-26 ENCOUNTER — Ambulatory Visit (INDEPENDENT_AMBULATORY_CARE_PROVIDER_SITE_OTHER): Payer: Medicare Other

## 2019-12-26 ENCOUNTER — Other Ambulatory Visit: Payer: Self-pay

## 2019-12-26 ENCOUNTER — Ambulatory Visit: Payer: Medicare Other | Admitting: Podiatry

## 2019-12-26 VITALS — Temp 98.1°F

## 2019-12-26 DIAGNOSIS — M7662 Achilles tendinitis, left leg: Secondary | ICD-10-CM | POA: Diagnosis not present

## 2019-12-26 DIAGNOSIS — M722 Plantar fascial fibromatosis: Secondary | ICD-10-CM | POA: Diagnosis not present

## 2019-12-26 NOTE — Patient Instructions (Signed)

## 2019-12-27 NOTE — Progress Notes (Signed)
Subjective:   Patient ID: Jon Sanchez, male   DOB: 79 y.o.   MRN: [id number]   HPI Patient presents stating he has developed discomfort in the plantar aspect of his left heel over the last few months and he had a tear of his Achilles tendon about 18 months ago.  States that overall he is done well for that period of time but the pain is quite intense on his heel and he does not smoke likes to be active   Review of Systems  All other systems reviewed and are negative.       Objective:  Physical Exam Vitals and nursing note reviewed.  Constitutional:      Appearance: He is well-developed.  Pulmonary:     Effort: Pulmonary effort is normal.  Musculoskeletal:        General: Normal range of motion.  Skin:    General: Skin is warm.  Neurological:     Mental Status: He is alert.     Neurovascular status intact muscle strength adequate range of motion within normal limits.  Patient is found to have exquisite discomfort plantar aspect left heel at the insertional point of the tendon into the calcaneus with inflammation fluid with incision from previous Achilles tendon injury left and no current indication of equinus.  Patient is found to have good digital perfusion well oriented x3     Assessment:  Acute plantar fasciitis left with previous tear of the Achilles tendon     Plan:  H&P conditions reviewed and will get a focus on the plantar heel even though I educated him on importance of stretching for the Achilles.  Sterile prep done injected the fascia 3 mg Kenalog 5 g liken applied fascial brace with instructions on usage instructed on supportive shoes and reappoint in the next several weeks  X-rays indicate that there is plantar spur no indication to stress fracture arthritis

## 2020-06-03 NOTE — Progress Notes (Signed)
Cardiology Office Note   Date:  06/05/2020   ID:  Jon Sanchez, Jon Sanchez [birth year], MRN [id number]  PCP:  Jon Cruel, MD    No chief complaint on file.  CAD  Wt Readings from Last 3 Encounters:  06/05/20 151 lb 3.2 oz (68.6 kg)  06/06/19 149 lb 6.4 oz (67.8 kg)  05/31/18 148 lb 6.4 oz (67.3 kg)       History of Present Illness: Jon Sanchez is a 79 y.o. male  who has had a DES to the LAD in 5/07. He has hearing loss and tinnitus in the right ear.  Diagnosed with DM.He lost weight and was started on metformin.This was stopped later. He is now retired  He had an achilles tendon problem.He had surgery in 9/18 at [facility].  He joined a gym.   No bleeding problems. Bruising easily. Eating healthy.   Clonidine added at night to help with sleep. No high BP readings.   He is fully retired.  Had achilles surgery in 2018.    He was in a study for an RSV vaccine and did well.  Since the last visit, he has done well.  Denies : Chest pain. Dizziness. Leg edema. Nitroglycerin use. Orthopnea. Palpitations. Paroxysmal nocturnal dyspnea. Shortness of breath. Syncope.   His testosterone is low.  No bleeding issues.       Past Medical History:  Diagnosis Date  . Actinic keratoses   . Allergic rhinitis   . Anxiety   . Benign essential tremor   . CAD (coronary artery disease)   . Chest pain   . Diabetes ([place])   . GERD (gastroesophageal reflux disease)   . Hyperlipidemia   . Myocardial infarct ([place])   . Osteoarthrosis, unspecified whether generalized or localized, hand   . Pain in joint     Past Surgical History:  Procedure Laterality Date  . CORONARY ANGIOPLASTY  2007     Current Outpatient Medications  Medication Sig Dispense Refill  . atorvastatin (LIPITOR) 20 MG tablet Take 1 tablet (20 mg total) by mouth daily. 90 tablet 3  . B Complex Vitamins (VITAMIN B COMPLEX PO) Take 1 tablet by mouth daily.    . clopidogrel (PLAVIX)  75 MG tablet Take 1 tablet by mouth once daily 90 tablet 3  . LORazepam (ATIVAN) 0.5 MG tablet Take 0.5 mg by mouth every 4 (four) hours as needed for anxiety.     Marland Kitchen losartan (COZAAR) 25 MG tablet TAKE 1 TABLET (25 MG TOTAL) BY MOUTH DAILY. 90 tablet 2  . metFORMIN (GLUCOPHAGE) 500 MG tablet Take 500 mg by mouth daily with breakfast.     . Multiple Vitamin (MULTI-VITAMINS) TABS Take 1 tablet by mouth daily.     . nitroGLYCERIN (NITROSTAT) 0.4 MG SL tablet DISSOLVE 1 TABLET UNDER THE TONGUE EVERY 5 MINUTES AS NEEDED FOR CHEST PAIN 75 tablet 2  . Omega-3 Fatty Acids (FISH OIL PO) Take 1 tablet by mouth daily.    Marland Kitchen PROAIR HFA 108 (90 BASE) MCG/ACT inhaler Inhale 2 puffs into the lungs every 6 (six) hours as needed (WHEEZING).     Marland Kitchen propranolol ER (INDERAL LA) 60 MG 24 hr capsule Take 60 mg by mouth daily.    . sildenafil (REVATIO) 20 MG tablet SMARTSIG:3-5 Tablet(s) By Mouth Daily    . tamsulosin (FLOMAX) 0.4 MG CAPS capsule Take 0.4 mg by mouth daily.     No current facility-administered medications for this visit.    Allergies:  Codeine    Social History:  The patient  reports that he has never smoked. He has never used smokeless tobacco. He reports that he does not drink alcohol and does not use drugs.   Family History:  The patient's family history includes Colon cancer in his mother; Coronary artery disease in an other family member; Heart attack in his father; Hypertension in his mother; Lung cancer in his father.    ROS:  Please see the history of present illness.   Otherwise, review of systems are positive for now has responsibility of taking care of a dog.   All other systems are reviewed and negative.    PHYSICAL EXAM: VS:  BP (!) 112/58   Pulse 65   Ht 5\' 7"  (1.702 m)   Wt 151 lb 3.2 oz (68.6 kg)   SpO2 98%   BMI 23.68 kg/m  , BMI Body mass index is 23.68 kg/m. GEN: Well nourished, well developed, in no acute distress  HEENT: normal  Neck: no JVD, carotid bruits, or  masses Cardiac: RRR; no murmurs, rubs, or gallops,no edema  Respiratory:  clear to auscultation bilaterally, normal work of breathing GI: soft, nontender, nondistended, + BS MS: no deformity or atrophy  Skin: warm and dry, no rash Neuro:  Strength and sensation are intact Psych: euthymic mood, full affect   EKG:   The ekg ordered today demonstrates NSR, no ST changes   Recent Labs: No results found for requested labs within last 8760 hours.   Lipid Panel    Component Value Date/Time   CHOL 106 10/11/2016 1443   CHOL 99 07/27/2013 0907   TRIG 159 (H) 10/11/2016 1443   TRIG 102 07/27/2013 0907   HDL 31 (L) 10/11/2016 1443   HDL 35 (L) 07/27/2013 0907   CHOLHDL 3.4 10/11/2016 1443   CHOLHDL 3.2 09/01/2015 0845   VLDL 22 09/01/2015 0845   LDLCALC 43 10/11/2016 1443   LDLCALC 44 07/27/2013 0907     Other studies Reviewed: Additional studies/ records that were reviewed today with results demonstrating: labs reviewed.   ASSESSMENT AND PLAN:  1. CAD/Old MI: No angina. Continue aggressive secondary prevention. Continue clopidogrel monotherapy.  Gets colonoscopies at [facility]. 2. DM: whole food plant based diet. A1C 6.1 in 6/21. 3. Hyperlipidemia: The current medical regimen is effective;  continue present plan and medications.   4. HTN: The current medical regimen is effective;  continue present plan and medications.  Low salt diet.     Current medicines are reviewed at length with the patient today.  The patient concerns regarding his medicines were addressed.  The following changes have been made:  No change  Labs/ tests ordered today include:  No orders of the defined types were placed in this encounter.   Recommend 150 minutes/week of aerobic exercise Low fat, low carb, high fiber diet recommended  Disposition:   FU in 1 year     Signed, Larae Grooms, MD  06/05/2020 9:22 AM    [facility] [address], [place], [place]   [address] Phone: [phone]; Fax: [phone]

## 2020-06-05 ENCOUNTER — Ambulatory Visit: Payer: Medicare Other | Admitting: Interventional Cardiology

## 2020-06-05 ENCOUNTER — Other Ambulatory Visit: Payer: Self-pay

## 2020-06-05 ENCOUNTER — Encounter: Payer: Self-pay | Admitting: Interventional Cardiology

## 2020-06-05 VITALS — BP 112/58 | HR 65 | Ht 67.0 in | Wt 151.2 lb

## 2020-06-05 DIAGNOSIS — I252 Old myocardial infarction: Secondary | ICD-10-CM | POA: Diagnosis not present

## 2020-06-05 DIAGNOSIS — E1159 Type 2 diabetes mellitus with other circulatory complications: Secondary | ICD-10-CM | POA: Diagnosis not present

## 2020-06-05 DIAGNOSIS — I10 Essential (primary) hypertension: Secondary | ICD-10-CM | POA: Diagnosis not present

## 2020-06-05 DIAGNOSIS — I25119 Atherosclerotic heart disease of native coronary artery with unspecified angina pectoris: Secondary | ICD-10-CM | POA: Diagnosis not present

## 2020-06-05 DIAGNOSIS — E782 Mixed hyperlipidemia: Secondary | ICD-10-CM

## 2020-06-05 NOTE — Patient Instructions (Signed)
Medication Instructions:  Your physician recommends that you continue on your current medications as directed. Please refer to the Current Medication list given to you today.  *If you need a refill on your cardiac medications before your next appointment, please call your pharmacy*   Lab Work: none If you have labs (blood work) drawn today and your tests are completely normal, you will receive your results only by: Marland Kitchen MyChart Message (if you have MyChart) OR . A paper copy in the mail If you have any lab test that is abnormal or we need to change your treatment, we will call you to review the results.   Testing/Procedures: none   Follow-Up: At [facility], you and your health needs are our priority.  As part of our continuing mission to provide you with exceptional heart care, we have created designated Provider Care Teams.  These Care Teams include your primary Cardiologist (physician) and Advanced Practice Providers (APPs -  Physician Assistants and Nurse Practitioners) who all work together to provide you with the care you need, when you need it.  We recommend signing up for the patient portal called "MyChart".  Sign up information is provided on this After Visit Summary.  MyChart is used to connect with patients for Virtual Visits (Telemedicine).  Patients are able to view lab/test results, encounter notes, upcoming appointments, etc.  Non-urgent messages can be sent to your provider as well.   To learn more about what you can do with MyChart, go to [email or web address].    Your next appointment:   one year(s)  The format for your next appointment:   In Person  Provider:   You may see Larae Grooms, MD or one of the following Advanced Practice Providers on your designated Care Team:    Melina Copa, PA-C  Ermalinda Barrios, PA-C    Other Instructions   High-Fiber Diet Fiber, also called dietary fiber, is a type of carbohydrate that is found in fruits, vegetables,  whole grains, and beans. A high-fiber diet can have many health benefits. Your health care provider may recommend a high-fiber diet to help:  Prevent constipation. Fiber can make your bowel movements more regular.  Lower your cholesterol.  Relieve the following conditions: ? Swelling of veins in the anus (hemorrhoids). ? Swelling and irritation (inflammation) of specific areas of the digestive tract (uncomplicated diverticulosis). ? A problem of the large intestine (colon) that sometimes causes pain and diarrhea (irritable bowel syndrome, IBS).  Prevent overeating as part of a weight-loss plan.  Prevent heart disease, type 2 diabetes, and certain cancers. What is my plan? The recommended daily fiber intake in grams (g) includes:  38 g for men age 67 or younger.  30 g for men over age 12.  79 g for women age 60 or younger.  21 g for women over age 7. You can get the recommended daily intake of dietary fiber by:  Eating a variety of fruits, vegetables, grains, and beans.  Taking a fiber supplement, if it is not possible to get enough fiber through your diet. What do I need to know about a high-fiber diet?  It is better to get fiber through food sources rather than from fiber supplements. There is not a lot of research about how effective supplements are.  Always check the fiber content on the nutrition facts label of any prepackaged food. Look for foods that contain 5 g of fiber or more per serving.  Talk with a diet and nutrition specialist (dietitian) if  you have questions about specific foods that are recommended or not recommended for your medical condition, especially if those foods are not listed below.  Gradually increase how much fiber you consume. If you increase your intake of dietary fiber too quickly, you may have bloating, cramping, or gas.  Drink plenty of water. Water helps you to digest fiber. What are tips for following this plan?  Eat a wide variety of  high-fiber foods.  Make sure that half of the grains that you eat each day are whole grains.  Eat breads and cereals that are made with whole-grain flour instead of refined flour or white flour.  Eat brown rice, bulgur wheat, or millet instead of white rice.  Start the day with a breakfast that is high in fiber, such as a cereal that contains 5 g of fiber or more per serving.  Use beans in place of meat in soups, salads, and pasta dishes.  Eat high-fiber snacks, such as berries, raw vegetables, nuts, and popcorn.  Choose whole fruits and vegetables instead of processed forms like juice or sauce. What foods can I eat?  Fruits Berries. Pears. Apples. Oranges. Avocado. Prunes and raisins. Dried figs. Vegetables Sweet potatoes. Spinach. Kale. Artichokes. Cabbage. Broccoli. Cauliflower. Green peas. Carrots. Squash. Grains Whole-grain breads. Multigrain cereal. Oats and oatmeal. Brown rice. Barley. Bulgur wheat. [place]. Quinoa. Bran muffins. Popcorn. Rye wafer crackers. Meats and other proteins Navy, kidney, and pinto beans. Soybeans. Split peas. Lentils. Nuts and seeds. Dairy Fiber-fortified yogurt. Beverages Fiber-fortified soy milk. Fiber-fortified orange juice. Other foods Fiber bars. The items listed above may not be a complete list of recommended foods and beverages. Contact a dietitian for more options. What foods are not recommended? Fruits Fruit juice. Cooked, strained fruit. Vegetables Fried potatoes. Canned vegetables. Well-cooked vegetables. Grains White bread. Pasta made with refined flour. White rice. Meats and other proteins Fatty cuts of meat. Fried chicken or fried fish. Dairy Milk. Yogurt. Cream cheese. Sour cream. Fats and oils Butters. Beverages Soft drinks. Other foods Cakes and pastries. The items listed above may not be a complete list of foods and beverages to avoid. Contact a dietitian for more information. Summary  Fiber is a type of  carbohydrate. It is found in fruits, vegetables, whole grains, and beans.  There are many health benefits of eating a high-fiber diet, such as preventing constipation, lowering blood cholesterol, helping with weight loss, and reducing your risk of heart disease, diabetes, and certain cancers.  Gradually increase your intake of fiber. Increasing too fast can result in cramping, bloating, and gas. Drink plenty of water while you increase your fiber.  The best sources of fiber include whole fruits and vegetables, whole grains, nuts, seeds, and beans. This information is not intended to replace advice given to you by your health care provider. Make sure you discuss any questions you have with your health care provider. Document Revised: 04/11/2017 Document Reviewed: 04/11/2017 Elsevier Patient Education  2020 [employer].

## 2020-06-26 DIAGNOSIS — Z23 Encounter for immunization: Secondary | ICD-10-CM | POA: Diagnosis not present

## 2020-06-26 DIAGNOSIS — L570 Actinic keratosis: Secondary | ICD-10-CM | POA: Diagnosis not present

## 2020-06-26 DIAGNOSIS — I10 Essential (primary) hypertension: Secondary | ICD-10-CM | POA: Diagnosis not present

## 2020-06-26 DIAGNOSIS — E1169 Type 2 diabetes mellitus with other specified complication: Secondary | ICD-10-CM | POA: Diagnosis not present

## 2020-08-18 DIAGNOSIS — J988 Other specified respiratory disorders: Secondary | ICD-10-CM | POA: Diagnosis not present

## 2020-08-18 DIAGNOSIS — B9789 Other viral agents as the cause of diseases classified elsewhere: Secondary | ICD-10-CM | POA: Diagnosis not present

## 2020-08-19 ENCOUNTER — Other Ambulatory Visit (HOSPITAL_COMMUNITY): Payer: Self-pay | Admitting: Family Medicine

## 2020-08-19 MED FILL — PAXLOVID 20 X 150 MG & 10 X: 20 X 150 MG | 5 days supply | Qty: 30 | Fill #0

## 2020-08-21 ENCOUNTER — Other Ambulatory Visit: Payer: Self-pay | Admitting: Infectious Diseases

## 2020-08-21 ENCOUNTER — Telehealth: Payer: Self-pay | Admitting: Infectious Diseases

## 2020-08-21 DIAGNOSIS — U071 COVID-19: Secondary | ICD-10-CM

## 2020-08-21 NOTE — Progress Notes (Signed)
I connected by phone with Jon Sanchez on 08/21/2020 at 2:26 PM to discuss the potential use of a new treatment for mild to moderate COVID-19 viral infection in non-hospitalized patients.  This patient is a 80 y.o. male that meets the FDA criteria for Emergency Use Authorization of COVID monoclonal antibody sotrovimab.  Has a (+) direct SARS-CoV-2 viral test result  Has mild or moderate COVID-19   Is NOT hospitalized due to COVID-19  Is within 10 days of symptom onset  Has at least one of the high risk factor(s) for progression to severe COVID-19 and/or hospitalization as defined in EUA.  Specific high risk criteria : Older age (>/= 79 yo)   I have spoken and communicated the following to the patient or parent/caregiver regarding COVID monoclonal antibody treatment:  1. FDA has authorized the emergency use for the treatment of mild to moderate COVID-19 in adults and pediatric patients with positive results of direct SARS-CoV-2 viral testing who are 42 years of age and older weighing at least 40 kg, and who are at high risk for progressing to severe COVID-19 and/or hospitalization.  2. The significant known and potential risks and benefits of COVID monoclonal antibody, and the extent to which such potential risks and benefits are unknown.  3. Information on available alternative treatments and the risks and benefits of those alternatives, including clinical trials.  4. Patients treated with COVID monoclonal antibody should continue to self-isolate and use infection control measures (e.g., wear mask, isolate, social distance, avoid sharing personal items, clean and disinfect "high touch" surfaces, and frequent handwashing) according to CDC guidelines.   5. The patient or parent/caregiver has the option to accept or refuse COVID monoclonal antibody treatment.  After reviewing this information with the patient, the patient has agreed to receive one of the available covid 19 monoclonal  antibodies and will be provided an appropriate fact sheet prior to infusion. [name], NP 08/21/2020 2:26 PM

## 2020-08-21 NOTE — Telephone Encounter (Signed)
Called to discuss with patient about COVID-19 symptoms and the use of one of the available treatments for those with mild to moderate Covid symptoms and at a high risk of hospitalization.  Pt appears to qualify for outpatient treatment due to co-morbid conditions and/or a member of an at-risk group in accordance with the FDA Emergency Use Authorization.    Symptom onset: 2/27 tested positive Tuesday. Coughing his head off, aching a lot.  Vaccinated: yes  Booster? no Immunocompromised? no Qualifiers: Age  Scheduled for monoclonal antibody infusion on 3/4 @ 11:30   [name]

## 2020-08-22 ENCOUNTER — Ambulatory Visit (HOSPITAL_COMMUNITY)
Admission: RE | Admit: 2020-08-22 | Discharge: 2020-08-22 | Disposition: A | Discharge status: Home / Self Care | Payer: Medicare Other | Source: Ambulatory Visit | Attending: Pulmonary Disease | Admitting: Pulmonary Disease

## 2020-08-22 DIAGNOSIS — U071 COVID-19: Secondary | ICD-10-CM | POA: Diagnosis not present

## 2020-08-22 MED ORDER — ALBUTEROL SULFATE HFA 108 (90 BASE) MCG/ACT IN AERS: 2.0000 | INHALATION_SPRAY | Freq: Once | RESPIRATORY_TRACT | Status: DC | PRN

## 2020-08-22 MED ORDER — EPINEPHRINE 0.3 MG/0.3ML IJ SOAJ: 0.3000 mg | Freq: Once | INTRAMUSCULAR | Status: DC | PRN

## 2020-08-22 MED ORDER — FAMOTIDINE IN NACL 20-0.9 MG/50ML-% IV SOLN: 20.0000 mg | Freq: Once | INTRAVENOUS | Status: DC | PRN

## 2020-08-22 MED ORDER — DIPHENHYDRAMINE HCL 50 MG/ML IJ SOLN: 50.0000 mg | Freq: Once | INTRAMUSCULAR | Status: DC | PRN

## 2020-08-22 MED ORDER — SODIUM CHLORIDE 0.9 % IV SOLN: INTRAVENOUS | Status: DC | PRN

## 2020-08-22 MED ORDER — METHYLPREDNISOLONE SODIUM SUCC 125 MG IJ SOLR: 125.0000 mg | Freq: Once | INTRAMUSCULAR | Status: DC | PRN

## 2020-08-22 MED ORDER — SOTROVIMAB 500 MG/8ML IV SOLN
500.0000 mg | Freq: Once | INTRAVENOUS | Status: AC
  Administered 2020-08-22: 500 mg via INTRAVENOUS

## 2020-08-22 NOTE — Discharge Instructions (Signed)

## 2020-10-28 DIAGNOSIS — L570 Actinic keratosis: Secondary | ICD-10-CM | POA: Diagnosis not present

## 2020-10-28 DIAGNOSIS — L821 Other seborrheic keratosis: Secondary | ICD-10-CM | POA: Diagnosis not present

## 2020-10-28 DIAGNOSIS — Z85828 Personal history of other malignant neoplasm of skin: Secondary | ICD-10-CM | POA: Diagnosis not present

## 2021-01-12 DIAGNOSIS — G479 Sleep disorder, unspecified: Secondary | ICD-10-CM | POA: Diagnosis not present

## 2021-01-12 DIAGNOSIS — M189 Osteoarthritis of first carpometacarpal joint, unspecified: Secondary | ICD-10-CM | POA: Diagnosis not present

## 2021-01-12 DIAGNOSIS — E782 Mixed hyperlipidemia: Secondary | ICD-10-CM | POA: Diagnosis not present

## 2021-01-12 DIAGNOSIS — Z23 Encounter for immunization: Secondary | ICD-10-CM | POA: Diagnosis not present

## 2021-01-12 DIAGNOSIS — E1169 Type 2 diabetes mellitus with other specified complication: Secondary | ICD-10-CM | POA: Diagnosis not present

## 2021-01-12 DIAGNOSIS — Z0000 Encounter for general adult medical examination without abnormal findings: Secondary | ICD-10-CM | POA: Diagnosis not present

## 2021-01-12 DIAGNOSIS — I10 Essential (primary) hypertension: Secondary | ICD-10-CM | POA: Diagnosis not present

## 2021-01-12 DIAGNOSIS — G4700 Insomnia, unspecified: Secondary | ICD-10-CM | POA: Diagnosis not present

## 2021-01-12 DIAGNOSIS — L570 Actinic keratosis: Secondary | ICD-10-CM | POA: Diagnosis not present

## 2021-01-19 DIAGNOSIS — Z0000 Encounter for general adult medical examination without abnormal findings: Secondary | ICD-10-CM | POA: Diagnosis not present

## 2021-01-19 DIAGNOSIS — I10 Essential (primary) hypertension: Secondary | ICD-10-CM | POA: Diagnosis not present

## 2021-01-19 DIAGNOSIS — E1169 Type 2 diabetes mellitus with other specified complication: Secondary | ICD-10-CM | POA: Diagnosis not present

## 2021-01-19 DIAGNOSIS — E782 Mixed hyperlipidemia: Secondary | ICD-10-CM | POA: Diagnosis not present

## 2021-01-19 DIAGNOSIS — I2510 Atherosclerotic heart disease of native coronary artery without angina pectoris: Secondary | ICD-10-CM | POA: Diagnosis not present

## 2021-02-05 DIAGNOSIS — N138 Other obstructive and reflux uropathy: Secondary | ICD-10-CM | POA: Diagnosis not present

## 2021-02-05 DIAGNOSIS — N35814 Other anterior urethral stricture, male: Secondary | ICD-10-CM | POA: Diagnosis not present

## 2021-03-11 DIAGNOSIS — M62830 Muscle spasm of back: Secondary | ICD-10-CM | POA: Diagnosis not present

## 2021-03-11 DIAGNOSIS — Z23 Encounter for immunization: Secondary | ICD-10-CM | POA: Diagnosis not present

## 2021-03-11 DIAGNOSIS — L570 Actinic keratosis: Secondary | ICD-10-CM | POA: Diagnosis not present

## 2021-03-11 DIAGNOSIS — M545 Low back pain, unspecified: Secondary | ICD-10-CM | POA: Diagnosis not present

## 2021-05-06 DIAGNOSIS — H43811 Vitreous degeneration, right eye: Secondary | ICD-10-CM | POA: Diagnosis not present

## 2021-05-06 DIAGNOSIS — H2513 Age-related nuclear cataract, bilateral: Secondary | ICD-10-CM | POA: Diagnosis not present

## 2021-05-06 DIAGNOSIS — H35371 Puckering of macula, right eye: Secondary | ICD-10-CM | POA: Diagnosis not present

## 2021-05-06 DIAGNOSIS — E119 Type 2 diabetes mellitus without complications: Secondary | ICD-10-CM | POA: Diagnosis not present

## 2021-05-06 DIAGNOSIS — H02423 Myogenic ptosis of bilateral eyelids: Secondary | ICD-10-CM | POA: Diagnosis not present

## 2021-06-04 DIAGNOSIS — U071 COVID-19: Secondary | ICD-10-CM | POA: Diagnosis not present

## 2021-06-04 DIAGNOSIS — Z03818 Encounter for observation for suspected exposure to other biological agents ruled out: Secondary | ICD-10-CM | POA: Diagnosis not present

## 2021-06-04 DIAGNOSIS — R051 Acute cough: Secondary | ICD-10-CM | POA: Diagnosis not present

## 2021-07-23 DIAGNOSIS — I10 Essential (primary) hypertension: Secondary | ICD-10-CM | POA: Diagnosis not present

## 2021-07-23 DIAGNOSIS — E1169 Type 2 diabetes mellitus with other specified complication: Secondary | ICD-10-CM | POA: Diagnosis not present

## 2021-07-28 DIAGNOSIS — R1011 Right upper quadrant pain: Secondary | ICD-10-CM | POA: Diagnosis not present

## 2021-08-04 DIAGNOSIS — H9313 Tinnitus, bilateral: Secondary | ICD-10-CM | POA: Diagnosis not present

## 2021-08-04 DIAGNOSIS — J343 Hypertrophy of nasal turbinates: Secondary | ICD-10-CM | POA: Diagnosis not present

## 2021-08-04 DIAGNOSIS — H9201 Otalgia, right ear: Secondary | ICD-10-CM | POA: Diagnosis not present

## 2021-08-04 DIAGNOSIS — R1011 Right upper quadrant pain: Secondary | ICD-10-CM | POA: Diagnosis not present

## 2021-08-04 DIAGNOSIS — J342 Deviated nasal septum: Secondary | ICD-10-CM | POA: Diagnosis not present

## 2021-08-04 DIAGNOSIS — J3489 Other specified disorders of nose and nasal sinuses: Secondary | ICD-10-CM | POA: Diagnosis not present

## 2021-09-10 DIAGNOSIS — N35919 Unspecified urethral stricture, male, unspecified site: Secondary | ICD-10-CM | POA: Diagnosis not present

## 2021-09-16 DIAGNOSIS — H9193 Unspecified hearing loss, bilateral: Secondary | ICD-10-CM | POA: Diagnosis not present

## 2021-09-16 DIAGNOSIS — H9313 Tinnitus, bilateral: Secondary | ICD-10-CM | POA: Diagnosis not present

## 2021-09-16 DIAGNOSIS — J310 Chronic rhinitis: Secondary | ICD-10-CM | POA: Diagnosis not present

## 2021-09-16 DIAGNOSIS — H903 Sensorineural hearing loss, bilateral: Secondary | ICD-10-CM | POA: Diagnosis not present

## 2021-10-19 NOTE — Progress Notes (Signed)
?  ?Cardiology Office Note ? ? ?Date:  10/21/2021  ? ?ID:  Jon Sanchez, DOB [birth year], MRN [id number] ? ?PCP:  Jon Cruel, MD  ? ? ?No chief complaint on file. ? ?CAD ? ?Wt Readings from Last 3 Encounters:  ?10/21/21 149 lb 6.4 oz (67.8 kg)  ?06/05/20 151 lb 3.2 oz (68.6 kg)  ?06/06/19 149 lb 6.4 oz (67.8 kg)  ?  ? ?  ?History of Present Illness: ?Jon Sanchez is a 81 y.o. male  who has had a DES to the LAD in 5/07. He has hearing loss and tinnitus in the right ear. ?  ?Diagnosed with DM.  He lost weight and was started on metformin.  This was stopped later.    ?He had an achilles tendon problem.  He had surgery in 9/18 at [facility]. ?   ?Clonidine added at night to help with sleep. No high BP readings.  ?  ?He is fully retired.   ?  ?He was in a study for an RSV vaccine and did well. ? ?He has travelled some.   ? ?Denies : Chest pain. Dizziness. Leg edema. Nitroglycerin use. Orthopnea. Palpitations. Paroxysmal nocturnal dyspnea. Shortness of breath. Syncope.   ? ?Walks one mile daily. Does yard work. Has a dog that needs to be walked. No cardiac sx. ? ?Portion sizes have decreased.  ? ? ?Past Medical History:  ?Diagnosis Date  ? Actinic keratoses   ? Allergic rhinitis   ? Anxiety   ? Benign essential tremor   ? CAD (coronary artery disease)   ? Chest pain   ? Diabetes ([place])   ? GERD (gastroesophageal reflux disease)   ? Hyperlipidemia   ? Myocardial infarct [facility])   ? Osteoarthrosis, unspecified whether generalized or localized, hand   ? Pain in joint   ? ? ?Past Surgical History:  ?Procedure Laterality Date  ? CORONARY ANGIOPLASTY  2007  ? ? ? ?Current Outpatient Medications  ?Medication Sig Dispense Refill  ? atorvastatin (LIPITOR) 20 MG tablet Take 1 tablet (20 mg total) by mouth daily. 90 tablet 3  ? B Complex Vitamins (VITAMIN B COMPLEX PO) Take 1 tablet by mouth daily.    ? clopidogrel (PLAVIX) 75 MG tablet Take 1 tablet by mouth once daily 90 tablet 3  ? LORazepam (ATIVAN) 0.5 MG tablet Take  0.5 mg by mouth every 4 (four) hours as needed for anxiety.     ? metFORMIN (GLUCOPHAGE) 500 MG tablet Take 500 mg by mouth daily with breakfast.     ? Multiple Vitamin (MULTI-VITAMINS) TABS Take 1 tablet by mouth daily.     ? nitroGLYCERIN (NITROSTAT) 0.4 MG SL tablet DISSOLVE 1 TABLET UNDER THE TONGUE EVERY 5 MINUTES AS NEEDED FOR CHEST PAIN 75 tablet 2  ? Omega-3 Fatty Acids (FISH OIL PO) Take 1 tablet by mouth daily.    ? PROAIR HFA 108 (90 BASE) MCG/ACT inhaler Inhale 2 puffs into the lungs every 6 (six) hours as needed (WHEEZING).     ? propranolol ER (INDERAL LA) 60 MG 24 hr capsule Take 60 mg by mouth daily.    ? sildenafil (REVATIO) 20 MG tablet SMARTSIG:3-5 Tablet(s) By Mouth Daily    ? tamsulosin (FLOMAX) 0.4 MG CAPS capsule Take 0.4 mg by mouth daily.    ? ?No current facility-administered medications for this visit.  ? ? ?Allergies:   Codeine  ? ? ?Social History:  The patient  reports that he has never smoked. He has  never used smokeless tobacco. He reports that he does not drink alcohol and does not use drugs.  ? ?Family History:  The patient's family history includes Colon cancer in his mother; Coronary artery disease in an other family member; Heart attack in his father; Hypertension in his mother; Lung cancer in his father.  ? ? ?ROS:  Please see the history of present illness.   Otherwise, review of systems are positive for decreased appetite.   All other systems are reviewed and negative.  ? ? ?PHYSICAL EXAM: ?VS:  BP 110/60   Pulse 64   Ht '5\' 7"'$  (1.702 m)   Wt 149 lb 6.4 oz (67.8 kg)   SpO2 96%   BMI 23.40 kg/m?  , BMI Body mass index is 23.4 kg/m?. ?GEN: Well nourished, well developed, in no acute distress ?HEENT: normal ?Neck: no JVD, carotid bruits, or masses ?Cardiac: RRR; no murmurs, rubs, or gallops,no edema  ?Respiratory:  clear to auscultation bilaterally, normal work of breathing ?GI: soft, nontender, nondistended, + BS ?MS: no deformity or atrophy ?Skin: warm and dry, no  rash ?Neuro:  Strength and sensation are intact ?Psych: euthymic mood, full affect ? ? ?EKG:   ?The ekg ordered today demonstrates NSR, no ST segment changes ? ? ?Recent Labs: ?No results found for requested labs within last 8760 hours.  ? ?Lipid Panel ?   ?Component Value Date/Time  ? CHOL 106 10/11/2016 1443  ? CHOL 99 07/27/2013 0907  ? TRIG 159 (H) 10/11/2016 1443  ? TRIG 102 07/27/2013 0907  ? HDL 31 (L) 10/11/2016 1443  ? HDL 35 (L) 07/27/2013 8185  ? CHOLHDL 3.4 10/11/2016 1443  ? CHOLHDL 3.2 09/01/2015 0845  ? VLDL 22 09/01/2015 0845  ? LDLCALC 43 10/11/2016 1443  ? [place] 44 07/27/2013 0907  ? ?  ?Other studies Reviewed: ?Additional studies/ records that were reviewed today with results demonstrating: labs reviewed. ? ? ?ASSESSMENT AND PLAN: ? ?CAD/Old MI: Continue aggressive secondary prevention.  No angina.  No bleeding problems Plavix. ?DM: High-fiber diet.  Exercise to the target below.  A1C 5.7 on metformin.   ?Hyperlipidemia: Whole food, plant-based diet.  LDL 38. Continue atorvastatin.  ?HTN: Low-salt diet.  BPs at home have not been checked recently.  He has been off of losartan.  104/62 by my recheck.  We will hold off on adding losartan back. ? ? ?Current medicines are reviewed at length with the patient today.  The patient concerns regarding his medicines were addressed. ? ?The following changes have been made:  No change ? ?Labs/ tests ordered today include:  ?No orders of the defined types were placed in this encounter. ? ? ?Recommend 150 minutes/week of aerobic exercise ?Low fat, low carb, high fiber diet recommended ? ?Disposition:   FU in 1 year ? ? ?Signed, ?Jon Grooms, MD  ?10/21/2021 8:38 AM    ?[facility] ?[address], [place], [place]  [address] ?Phone: [phone]; Fax: [phone]  ? ?

## 2021-10-21 ENCOUNTER — Ambulatory Visit: Payer: Medicare Other | Admitting: Interventional Cardiology

## 2021-10-21 ENCOUNTER — Encounter: Payer: Self-pay | Admitting: Interventional Cardiology

## 2021-10-21 VITALS — BP 110/60 | HR 64 | Ht 67.0 in | Wt 149.4 lb

## 2021-10-21 DIAGNOSIS — E782 Mixed hyperlipidemia: Secondary | ICD-10-CM | POA: Diagnosis not present

## 2021-10-21 DIAGNOSIS — I10 Essential (primary) hypertension: Secondary | ICD-10-CM | POA: Diagnosis not present

## 2021-10-21 DIAGNOSIS — E1159 Type 2 diabetes mellitus with other circulatory complications: Secondary | ICD-10-CM | POA: Diagnosis not present

## 2021-10-21 DIAGNOSIS — I25119 Atherosclerotic heart disease of native coronary artery with unspecified angina pectoris: Secondary | ICD-10-CM

## 2021-10-21 DIAGNOSIS — I252 Old myocardial infarction: Secondary | ICD-10-CM

## 2021-10-21 NOTE — Patient Instructions (Signed)

## 2021-10-28 DIAGNOSIS — L570 Actinic keratosis: Secondary | ICD-10-CM | POA: Diagnosis not present

## 2021-10-28 DIAGNOSIS — L821 Other seborrheic keratosis: Secondary | ICD-10-CM | POA: Diagnosis not present

## 2021-10-28 DIAGNOSIS — Z85828 Personal history of other malignant neoplasm of skin: Secondary | ICD-10-CM | POA: Diagnosis not present

## 2022-01-02 DIAGNOSIS — U071 COVID-19: Secondary | ICD-10-CM | POA: Diagnosis not present

## 2022-01-02 DIAGNOSIS — R509 Fever, unspecified: Secondary | ICD-10-CM | POA: Diagnosis not present

## 2022-01-02 DIAGNOSIS — R059 Cough, unspecified: Secondary | ICD-10-CM | POA: Diagnosis not present

## 2022-01-28 DIAGNOSIS — I10 Essential (primary) hypertension: Secondary | ICD-10-CM | POA: Diagnosis not present

## 2022-01-28 DIAGNOSIS — E782 Mixed hyperlipidemia: Secondary | ICD-10-CM | POA: Diagnosis not present

## 2022-01-28 DIAGNOSIS — E1169 Type 2 diabetes mellitus with other specified complication: Secondary | ICD-10-CM | POA: Diagnosis not present

## 2022-02-04 DIAGNOSIS — E1169 Type 2 diabetes mellitus with other specified complication: Secondary | ICD-10-CM | POA: Diagnosis not present

## 2022-02-04 DIAGNOSIS — I2510 Atherosclerotic heart disease of native coronary artery without angina pectoris: Secondary | ICD-10-CM | POA: Diagnosis not present

## 2022-02-04 DIAGNOSIS — L570 Actinic keratosis: Secondary | ICD-10-CM | POA: Diagnosis not present

## 2022-02-04 DIAGNOSIS — Z0000 Encounter for general adult medical examination without abnormal findings: Secondary | ICD-10-CM | POA: Diagnosis not present

## 2022-02-04 DIAGNOSIS — E782 Mixed hyperlipidemia: Secondary | ICD-10-CM | POA: Diagnosis not present

## 2022-02-04 DIAGNOSIS — I10 Essential (primary) hypertension: Secondary | ICD-10-CM | POA: Diagnosis not present

## 2022-02-18 DIAGNOSIS — L570 Actinic keratosis: Secondary | ICD-10-CM | POA: Diagnosis not present

## 2022-05-10 DIAGNOSIS — H02423 Myogenic ptosis of bilateral eyelids: Secondary | ICD-10-CM | POA: Diagnosis not present

## 2022-05-10 DIAGNOSIS — H35371 Puckering of macula, right eye: Secondary | ICD-10-CM | POA: Diagnosis not present

## 2022-05-10 DIAGNOSIS — H2513 Age-related nuclear cataract, bilateral: Secondary | ICD-10-CM | POA: Diagnosis not present

## 2022-05-10 DIAGNOSIS — E119 Type 2 diabetes mellitus without complications: Secondary | ICD-10-CM | POA: Diagnosis not present

## 2022-05-10 DIAGNOSIS — H43811 Vitreous degeneration, right eye: Secondary | ICD-10-CM | POA: Diagnosis not present

## 2022-07-12 DIAGNOSIS — Z03818 Encounter for observation for suspected exposure to other biological agents ruled out: Secondary | ICD-10-CM | POA: Diagnosis not present

## 2022-07-12 DIAGNOSIS — Z6823 Body mass index (BMI) 23.0-23.9, adult: Secondary | ICD-10-CM | POA: Diagnosis not present

## 2022-07-12 DIAGNOSIS — J988 Other specified respiratory disorders: Secondary | ICD-10-CM | POA: Diagnosis not present

## 2022-07-12 DIAGNOSIS — B9789 Other viral agents as the cause of diseases classified elsewhere: Secondary | ICD-10-CM | POA: Diagnosis not present

## 2022-08-02 DIAGNOSIS — E1169 Type 2 diabetes mellitus with other specified complication: Secondary | ICD-10-CM | POA: Diagnosis not present

## 2022-08-03 DIAGNOSIS — L821 Other seborrheic keratosis: Secondary | ICD-10-CM | POA: Diagnosis not present

## 2022-08-03 DIAGNOSIS — E1136 Type 2 diabetes mellitus with diabetic cataract: Secondary | ICD-10-CM | POA: Diagnosis not present

## 2022-09-16 DIAGNOSIS — N35014 Post-traumatic urethral stricture, male, unspecified: Secondary | ICD-10-CM | POA: Diagnosis not present

## 2022-09-16 DIAGNOSIS — N138 Other obstructive and reflux uropathy: Secondary | ICD-10-CM | POA: Diagnosis not present

## 2022-11-29 DIAGNOSIS — R509 Fever, unspecified: Secondary | ICD-10-CM | POA: Diagnosis not present

## 2022-11-29 DIAGNOSIS — Z03818 Encounter for observation for suspected exposure to other biological agents ruled out: Secondary | ICD-10-CM | POA: Diagnosis not present

## 2022-11-29 DIAGNOSIS — R051 Acute cough: Secondary | ICD-10-CM | POA: Diagnosis not present

## 2022-12-07 NOTE — Progress Notes (Unsigned)
Cardiology Office Note:    Date:  12/08/2022   ID:  Jon Sanchez, DOB March 30, 1941, MRN [id number]  PCP:  Daisy Floro, MD  [facility] Providers Cardiologist:  Lance Muss, MD    Referring MD: Daisy Floro, MD   Chief Complaint:  No chief complaint on file.    Patient Profile:  CAD with DES to LAD in 2007 Hypertension Diabetes mellitus On metformin Hearing loss GERD       History of Present Illness:   Jon Sanchez is a 82 y.o. male with the above problem list.  He presents today for routine follow-up. Since his last visit, patient has remained active and gets regular exercise from walking his dog. He continues to enjoy retirement. Patient has made efforts to improve diet. Labs are checked by his PCP and were all at goal. He is back on meformin and last A1c was 5.9. He denies chest pain, shortness of breath, lower extremity edema, fatigue, palpitations, melena, hematuria, hemoptysis, diaphoresis, weakness, presyncope, syncope, orthopnea, and PND. He reports increased bruising on Plavix, feels that this is stable.     Past Medical History:  Diagnosis Date   Actinic keratoses    Allergic rhinitis    Anxiety    Benign essential tremor    CAD (coronary artery disease)    Chest pain    Diabetes (HCC)    GERD (gastroesophageal reflux disease)    Hyperlipidemia    Myocardial infarct (HCC)    Osteoarthrosis, unspecified whether generalized or localized, hand    Pain in joint    Current Medications: Current Meds  Medication Sig   atorvastatin (LIPITOR) 20 MG tablet Take 1 tablet (20 mg total) by mouth daily.   B Complex Vitamins (VITAMIN B COMPLEX PO) Take 1 tablet by mouth daily.   clopidogrel (PLAVIX) 75 MG tablet Take 1 tablet by mouth once daily   LORazepam (ATIVAN) 0.5 MG tablet Take 0.5 mg by mouth every 4 (four) hours as needed for anxiety.    metFORMIN (GLUCOPHAGE) 500 MG tablet Take 500 mg by mouth daily with breakfast.    Multiple  Vitamin (MULTI-VITAMINS) TABS Take 1 tablet by mouth daily.    Omega-3 Fatty Acids (FISH OIL PO) Take 1 tablet by mouth daily.   PROAIR HFA 108 (90 BASE) MCG/ACT inhaler Inhale 2 puffs into the lungs every 6 (six) hours as needed (WHEEZING).    propranolol ER (INDERAL LA) 60 MG 24 hr capsule Take 60 mg by mouth daily.   sildenafil (REVATIO) 20 MG tablet SMARTSIG:3-5 Tablet(s) By Mouth Daily   tamsulosin (FLOMAX) 0.4 MG CAPS capsule Take 0.4 mg by mouth daily.   [DISCONTINUED] nitroGLYCERIN (NITROSTAT) 0.4 MG SL tablet DISSOLVE 1 TABLET UNDER THE TONGUE EVERY 5 MINUTES AS NEEDED FOR CHEST PAIN    Allergies:   Codeine   Social History   Occupational History   Not on file  Tobacco Use   Smoking status: Never   Smokeless tobacco: Never   Tobacco comments:    tobacco use - no   Vaping Use   Vaping Use: Never used  Substance and Sexual Activity   Alcohol use: No   Drug use: No   Sexual activity: Not on file    Family Hx: The patient's family history includes Colon cancer in his mother; Coronary artery disease in an other family member; Heart attack in his father; Hypertension in his mother; Lung cancer in his father. There is no history of Stroke.  Review  of Systems  Constitutional: Negative for weight gain and weight loss.  Cardiovascular:  Negative for chest pain, dyspnea on exertion, irregular heartbeat, leg swelling, orthopnea, palpitations and syncope.  Respiratory:  Negative for cough, shortness of breath and snoring.   All other systems reviewed and are negative.    EKGs/Labs/Other Test Reviewed:    EKG:  EKG is ordered today.  EKG Interpretation  Date/Time:  Wednesday December 08 2022 09:41:44 EDT Ventricular Rate:  97 PR Interval:  168 QRS Duration: 88 QT Interval:  344 QTC Calculation: 436 R Axis:   20 Text Interpretation: Normal sinus rhythm Normal ECG When compared with ECG of 30-Oct-2005 04:23, ST no longer depressed in Lateral leads T wave inversion no longer  evident in Anterolateral leads Confirmed by Perlie Gold [phone]) on 12/08/2022 9:45:50 AM     Recent Labs: No results found for requested labs within last 365 days.   Recent Lipid Panel No results for input(s): "CHOL", "TRIG", "HDL", "VLDL", "LDLCALC", "LDLDIRECT" in the last 8760 hours.    Risk Assessment/Calculations/Metrics:              Physical Exam:    VS:  BP 116/62   Pulse 97   Ht 5\' 7"  (1.702 m)   Wt 142 lb 12.8 oz (64.8 kg)   SpO2 96%   BMI 22.37 kg/m     Wt Readings from Last 3 Encounters:  12/08/22 142 lb 12.8 oz (64.8 kg)  10/21/21 149 lb 6.4 oz (67.8 kg)  06/05/20 151 lb 3.2 oz (68.6 kg)    Constitutional:      Appearance: Healthy appearance. Not in distress.  Neck:     Vascular: No JVR. JVD normal.  Pulmonary:     Effort: Pulmonary effort is normal.     Breath sounds: Normal breath sounds. No wheezing. No rhonchi. No rales.  Chest:     Chest wall: Not tender to palpatation.  Cardiovascular:     PMI at left midclavicular line. Normal rate. Regular rhythm. Normal S1. Normal S2.      Murmurs: There is no murmur.     No gallop.  No click. No rub.  Pulses:    Intact distal pulses.  Edema:    Peripheral edema absent.  Abdominal:     General: Bowel sounds are normal.     Palpations: Abdomen is soft.     Tenderness: There is no abdominal tenderness.  Musculoskeletal: Normal range of motion.        General: No tenderness. Skin:    General: Skin is warm and dry.  Neurological:     General: No focal deficit present.     Mental Status: Alert and oriented to person, place and time.         ASSESSMENT & PLAN:   Coronary atherosclerosis Patient with CAD, prior DES to LAD in 2007. Per patient, he's had no chest pain or shortness of breath since his intervention. No use of nitroglycerin. Continue secondary prevention with Atorvastatin 20mg  and Plavix 75mg .   Essential hypertension, benign Blood pressure today is excellent. Continue Propranolol SR 60mg .  Patient without side effects such as lightheadedness/dizziness.  Mixed hyperlipidemia LDL excellent, 40 as of 01/28/22 PCP labs. Continue Atorvastatin 20mg  with LDL goal <50.  Diabetes mellitus (HCC) Last A1c 5.9. Continue Metformin and regular follow up with PCP.            Dispo:  No follow-ups on file.   Medication Adjustments/Labs and Tests Ordered: Current medicines are reviewed at  length with the patient today.  Concerns regarding medicines are outlined above.  Tests Ordered: Orders Placed This Encounter  Procedures   EKG 12-Lead   Medication Changes: Meds ordered this encounter  Medications   nitroGLYCERIN (NITROSTAT) 0.4 MG SL tablet    Sig: DISSOLVE 1 TABLET UNDER THE TONGUE EVERY 5 MINUTES AS NEEDED FOR CHEST PAIN    Dispense:  25 tablet    Refill:  3   Signed, Perlie Gold, PA-C  12/08/2022 12:53 PM    [facility] [address] [place], [place], [place]  [address] Phone: [phone]; Fax: [phone]

## 2022-12-08 ENCOUNTER — Encounter: Payer: Self-pay | Admitting: Cardiology

## 2022-12-08 ENCOUNTER — Ambulatory Visit: Payer: Medicare Other | Attending: Cardiology | Admitting: Cardiology

## 2022-12-08 VITALS — BP 116/62 | HR 97 | Ht 67.0 in | Wt 142.8 lb

## 2022-12-08 DIAGNOSIS — I2510 Atherosclerotic heart disease of native coronary artery without angina pectoris: Secondary | ICD-10-CM | POA: Diagnosis not present

## 2022-12-08 DIAGNOSIS — E782 Mixed hyperlipidemia: Secondary | ICD-10-CM

## 2022-12-08 DIAGNOSIS — I10 Essential (primary) hypertension: Secondary | ICD-10-CM | POA: Diagnosis not present

## 2022-12-08 DIAGNOSIS — Z7984 Long term (current) use of oral hypoglycemic drugs: Secondary | ICD-10-CM | POA: Diagnosis not present

## 2022-12-08 DIAGNOSIS — E1159 Type 2 diabetes mellitus with other circulatory complications: Secondary | ICD-10-CM | POA: Diagnosis not present

## 2022-12-08 MED ORDER — NITROGLYCERIN 0.4 MG SL SUBL: SUBLINGUAL_TABLET | SUBLINGUAL | 3 refills | Status: AC

## 2022-12-08 NOTE — Assessment & Plan Note (Signed)
Patient with CAD, prior DES to LAD in 2007. Per patient, he's had no chest pain or shortness of breath since his intervention. No use of nitroglycerin. Continue secondary prevention with Atorvastatin 20mg  and Plavix 75mg .

## 2022-12-08 NOTE — Patient Instructions (Signed)
Medication Instructions:  Your physician recommends that you continue on your current medications as directed. Please refer to the Current Medication list given to you today. *If you need a refill on your cardiac medications before your next appointment, please call your pharmacy*   Lab Work: None ordered If you have labs (blood work) drawn today and your tests are completely normal, you will receive your results only by: MyChart Message (if you have MyChart) OR A paper copy in the mail If you have any lab test that is abnormal or we need to change your treatment, we will call you to review the results.   Testing/Procedures: None ordered   Follow-Up: At [facility], you and your health needs are our priority.  As part of our continuing mission to provide you with exceptional heart care, we have created designated Provider Care Teams.  These Care Teams include your primary Cardiologist (physician) and Advanced Practice Providers (APPs -  Physician Assistants and Nurse Practitioners) who all work together to provide you with the care you need, when you need it.  We recommend signing up for the patient portal called "MyChart".  Sign up information is provided on this After Visit Summary.  MyChart is used to connect with patients for Virtual Visits (Telemedicine).  Patients are able to view lab/test results, encounter notes, upcoming appointments, etc.  Non-urgent messages can be sent to your provider as well.   To learn more about what you can do with MyChart, go to [email or web address].    Your next appointment:   12 month(s)  Provider:   Jayadeep Varanasi, MD     Other Instructions   

## 2022-12-08 NOTE — Assessment & Plan Note (Signed)
LDL excellent, 40 as of 01/28/22 PCP labs. Continue Atorvastatin 20mg  with LDL goal <50.

## 2022-12-08 NOTE — Assessment & Plan Note (Signed)
Last A1c 5.9. Continue Metformin and regular follow up with PCP.

## 2022-12-08 NOTE — Assessment & Plan Note (Signed)
Blood pressure today is excellent. Continue Propranolol SR 60mg . Patient without side effects such as lightheadedness/dizziness.

## 2023-02-15 DIAGNOSIS — E782 Mixed hyperlipidemia: Secondary | ICD-10-CM | POA: Diagnosis not present

## 2023-02-15 DIAGNOSIS — E1169 Type 2 diabetes mellitus with other specified complication: Secondary | ICD-10-CM | POA: Diagnosis not present

## 2023-02-15 DIAGNOSIS — I10 Essential (primary) hypertension: Secondary | ICD-10-CM | POA: Diagnosis not present

## 2023-02-24 DIAGNOSIS — I10 Essential (primary) hypertension: Secondary | ICD-10-CM | POA: Diagnosis not present

## 2023-02-24 DIAGNOSIS — E782 Mixed hyperlipidemia: Secondary | ICD-10-CM | POA: Diagnosis not present

## 2023-02-24 DIAGNOSIS — E1169 Type 2 diabetes mellitus with other specified complication: Secondary | ICD-10-CM | POA: Diagnosis not present

## 2023-02-24 DIAGNOSIS — I2510 Atherosclerotic heart disease of native coronary artery without angina pectoris: Secondary | ICD-10-CM | POA: Diagnosis not present

## 2023-02-24 DIAGNOSIS — Z23 Encounter for immunization: Secondary | ICD-10-CM | POA: Diagnosis not present

## 2023-02-24 DIAGNOSIS — Z0000 Encounter for general adult medical examination without abnormal findings: Secondary | ICD-10-CM | POA: Diagnosis not present

## 2023-02-24 DIAGNOSIS — E119 Type 2 diabetes mellitus without complications: Secondary | ICD-10-CM | POA: Diagnosis not present

## 2023-02-25 DIAGNOSIS — Z09 Encounter for follow-up examination after completed treatment for conditions other than malignant neoplasm: Secondary | ICD-10-CM | POA: Diagnosis not present

## 2023-02-25 DIAGNOSIS — D125 Benign neoplasm of sigmoid colon: Secondary | ICD-10-CM | POA: Diagnosis not present

## 2023-02-25 DIAGNOSIS — Z86010 Personal history of colonic polyps: Secondary | ICD-10-CM | POA: Diagnosis not present

## 2023-04-21 DIAGNOSIS — L570 Actinic keratosis: Secondary | ICD-10-CM | POA: Diagnosis not present

## 2023-04-21 DIAGNOSIS — Z85828 Personal history of other malignant neoplasm of skin: Secondary | ICD-10-CM | POA: Diagnosis not present

## 2023-04-21 DIAGNOSIS — L821 Other seborrheic keratosis: Secondary | ICD-10-CM | POA: Diagnosis not present

## 2023-05-23 DIAGNOSIS — H35371 Puckering of macula, right eye: Secondary | ICD-10-CM | POA: Diagnosis not present

## 2023-05-23 DIAGNOSIS — E119 Type 2 diabetes mellitus without complications: Secondary | ICD-10-CM | POA: Diagnosis not present

## 2023-05-23 DIAGNOSIS — H02423 Myogenic ptosis of bilateral eyelids: Secondary | ICD-10-CM | POA: Diagnosis not present

## 2023-05-23 DIAGNOSIS — H2513 Age-related nuclear cataract, bilateral: Secondary | ICD-10-CM | POA: Diagnosis not present

## 2023-05-23 DIAGNOSIS — H43811 Vitreous degeneration, right eye: Secondary | ICD-10-CM | POA: Diagnosis not present

## 2023-07-08 DIAGNOSIS — U071 COVID-19: Secondary | ICD-10-CM | POA: Diagnosis not present

## 2023-08-31 DIAGNOSIS — Z23 Encounter for immunization: Secondary | ICD-10-CM | POA: Diagnosis not present

## 2023-08-31 DIAGNOSIS — E1169 Type 2 diabetes mellitus with other specified complication: Secondary | ICD-10-CM | POA: Diagnosis not present

## 2023-08-31 DIAGNOSIS — I10 Essential (primary) hypertension: Secondary | ICD-10-CM | POA: Diagnosis not present

## 2023-09-22 DIAGNOSIS — N138 Other obstructive and reflux uropathy: Secondary | ICD-10-CM | POA: Diagnosis not present

## 2023-09-22 DIAGNOSIS — N35014 Post-traumatic urethral stricture, male, unspecified: Secondary | ICD-10-CM | POA: Diagnosis not present

## 2024-02-29 DIAGNOSIS — G250 Essential tremor: Secondary | ICD-10-CM | POA: Diagnosis not present

## 2024-02-29 DIAGNOSIS — E1169 Type 2 diabetes mellitus with other specified complication: Secondary | ICD-10-CM | POA: Diagnosis not present

## 2024-02-29 DIAGNOSIS — Z23 Encounter for immunization: Secondary | ICD-10-CM | POA: Diagnosis not present

## 2024-02-29 DIAGNOSIS — E782 Mixed hyperlipidemia: Secondary | ICD-10-CM | POA: Diagnosis not present

## 2024-02-29 DIAGNOSIS — Z0000 Encounter for general adult medical examination without abnormal findings: Secondary | ICD-10-CM | POA: Diagnosis not present

## 2024-02-29 DIAGNOSIS — G4700 Insomnia, unspecified: Secondary | ICD-10-CM | POA: Diagnosis not present

## 2024-02-29 DIAGNOSIS — M7752 Other enthesopathy of left foot: Secondary | ICD-10-CM | POA: Diagnosis not present

## 2024-02-29 DIAGNOSIS — I10 Essential (primary) hypertension: Secondary | ICD-10-CM | POA: Diagnosis not present

## 2024-02-29 DIAGNOSIS — I2510 Atherosclerotic heart disease of native coronary artery without angina pectoris: Secondary | ICD-10-CM | POA: Diagnosis not present

## 2024-07-17 ENCOUNTER — Ambulatory Visit: Admitting: Student in an Organized Health Care Education/Training Program

## 2024-07-17 NOTE — Progress Notes (Incomplete)
" °  Cardiology Office Note:  .   Date:  07/17/2024  ID:  Jon Sanchez, DOB 12-22-1940, MRN [id number] PCP: Jon Carlin Redbird, MD  [facility] Providers Cardiologist:  Jon Reek, MD { Chief Complaint: No chief complaint on file.   History of Present Illness: Jon    ABDULHADI Sanchez is a 84 y.o. male with a PMH of CAD s/p PCI to LAD (2007), HTN, HLD, DM2, GERD who presents for follow-up.   Pre-Chart Notes: - Last seen in clinic by APP in 2024 was doing well at that time.  Discussed the use of AI scribe software for clinical note transcription with the patient, who gave verbal consent to proceed.  History of Present Illness       Studies Reviewed: Jon    EKG: ***           Results   Risk Assessment/Calculations:    {Does this patient have ATRIAL FIBRILLATION?[phone]} No BP recorded.  {Refresh Note OR Click here to enter BP  :1}***        Physical Exam:    VS:  There were no vitals taken for this visit. ***    Wt Readings from Last 3 Encounters:  12/08/22 142 lb 12.8 oz (64.8 kg)  10/21/21 149 lb 6.4 oz (67.8 kg)  06/05/20 151 lb 3.2 oz (68.6 kg)     GEN: Well nourished, well developed, in no acute distress NECK: No JVD; No carotid bruits CARDIAC: ***RRR, no murmurs, rubs, gallops RESPIRATORY:  Clear to auscultation without rales, wheezing or rhonchi  ABDOMEN: Soft, non-tender, non-distended, normal bowel sounds EXTREMITIES:  Warm and well perfused, no edema; No deformity, 2+ radial pulses PSYCH: Normal mood and affect   ASSESSMENT AND PLAN: .        {Are you ordering a CV Procedure (e.g. stress test, cath, DCCV, TEE, etc)?   Press F2        :[id number]}   This note was written with the assistance of a dictation microphone or AI dictation software. Please excuse any typos or grammatical errors.   Signed, Jon Archer, MD  07/17/2024 8:04 AM     [facility] "

## 2024-07-18 ENCOUNTER — Encounter (HOSPITAL_BASED_OUTPATIENT_CLINIC_OR_DEPARTMENT_OTHER): Payer: Self-pay

## 2024-07-18 ENCOUNTER — Emergency Department (HOSPITAL_BASED_OUTPATIENT_CLINIC_OR_DEPARTMENT_OTHER)

## 2024-07-18 ENCOUNTER — Emergency Department (HOSPITAL_BASED_OUTPATIENT_CLINIC_OR_DEPARTMENT_OTHER)
Admission: EM | Admit: 2024-07-18 | Discharge: 2024-07-18 | Disposition: A | Discharge status: Home / Self Care | Attending: Emergency Medicine | Admitting: Emergency Medicine

## 2024-07-18 ENCOUNTER — Emergency Department (HOSPITAL_BASED_OUTPATIENT_CLINIC_OR_DEPARTMENT_OTHER): Admitting: Radiology

## 2024-07-18 ENCOUNTER — Other Ambulatory Visit: Payer: Self-pay

## 2024-07-18 DIAGNOSIS — S20212A Contusion of left front wall of thorax, initial encounter: Secondary | ICD-10-CM | POA: Diagnosis not present

## 2024-07-18 DIAGNOSIS — W19XXXA Unspecified fall, initial encounter: Secondary | ICD-10-CM

## 2024-07-18 DIAGNOSIS — S0003XA Contusion of scalp, initial encounter: Secondary | ICD-10-CM | POA: Insufficient documentation

## 2024-07-18 DIAGNOSIS — W000XXA Fall on same level due to ice and snow, initial encounter: Secondary | ICD-10-CM | POA: Insufficient documentation

## 2024-07-18 DIAGNOSIS — E119 Type 2 diabetes mellitus without complications: Secondary | ICD-10-CM | POA: Insufficient documentation

## 2024-07-18 DIAGNOSIS — Z7984 Long term (current) use of oral hypoglycemic drugs: Secondary | ICD-10-CM | POA: Insufficient documentation

## 2024-07-18 DIAGNOSIS — S060XAA Concussion with loss of consciousness status unknown, initial encounter: Secondary | ICD-10-CM | POA: Insufficient documentation

## 2024-07-18 DIAGNOSIS — S298XXA Other specified injuries of thorax, initial encounter: Secondary | ICD-10-CM

## 2024-07-18 DIAGNOSIS — I2510 Atherosclerotic heart disease of native coronary artery without angina pectoris: Secondary | ICD-10-CM | POA: Insufficient documentation

## 2024-07-18 DIAGNOSIS — Z7902 Long term (current) use of antithrombotics/antiplatelets: Secondary | ICD-10-CM | POA: Diagnosis not present

## 2024-07-18 DIAGNOSIS — S0990XA Unspecified injury of head, initial encounter: Secondary | ICD-10-CM | POA: Diagnosis present

## 2024-07-18 MED ORDER — LIDOCAINE 5 % EX PTCH
1.0000 | MEDICATED_PATCH | CUTANEOUS | Status: DC
  Administered 2024-07-18: 1 via TRANSDERMAL
  Filled 2024-07-18: qty 1

## 2024-07-18 MED ORDER — OXYCODONE HCL 5 MG PO TABS
5.0000 mg | ORAL_TABLET | Freq: Once | ORAL | Status: AC
  Administered 2024-07-18: 5 mg via ORAL
  Filled 2024-07-18: qty 1

## 2024-07-18 NOTE — ED Triage Notes (Signed)
 Pt reports falling and hitting head. Possible LOC. Pt takes Plavix .

## 2024-07-18 NOTE — ED Provider Notes (Signed)
 " [place] EMERGENCY DEPARTMENT AT [facility] Provider Note   CSN: [id number] Arrival date & time: 07/18/24  8167     Patient presents with: Jon Sanchez is a 84 y.o. male.   Patient is an 84 year old male with a history of CAD, diabetes, hyperlipidemia, essential tremor who is presenting today after a fall.  He went outside and slipped in the driveway falling backwards hitting his head on the ice.  He is unsure if he completely lost consciousness because there is a portion of time he does not remember.  The neighbor found him laying in the driveway and helped him up.  He was able to walk back into the house but since that time has had significant pain in the back of his head and upper neck and wife reports he has had some repetitive questioning.  He denies any visual changes or nausea and vomiting.  He feels like his arms and legs feel normal and he is able to walk without difficulty but has also been having pain in the left rib area in the back.  He has no shortness of breath.  He does take Plavix .  The history is provided by the patient and the spouse.  Fall       Prior to Admission medications  Medication Sig Start Date End Date Taking? Authorizing Provider  atorvastatin  (LIPITOR) 20 MG tablet Take 1 tablet (20 mg total) by mouth daily. 06/06/19   Dann Candyce RAMAN, MD  B Complex Vitamins (VITAMIN B COMPLEX PO) Take 1 tablet by mouth daily.    [provider]  clopidogrel  (PLAVIX ) 75 MG tablet Take 1 tablet by mouth once daily 06/18/19   Varanasi, Jayadeep S, MD  LORazepam (ATIVAN) 0.5 MG tablet Take 0.5 mg by mouth every 4 (four) hours as needed for anxiety.     [provider]  metFORMIN (GLUCOPHAGE) 500 MG tablet Take 500 mg by mouth daily with breakfast.     [provider]  Multiple Vitamin (MULTI-VITAMINS) TABS Take 1 tablet by mouth daily.     [provider]  nitroGLYCERIN  (NITROSTAT ) 0.4 MG SL tablet DISSOLVE 1  TABLET UNDER THE TONGUE EVERY 5 MINUTES AS NEEDED FOR CHEST PAIN 12/08/22   Trudy Birmingham, PA-C  Omega-3 Fatty Acids (FISH OIL PO) Take 1 tablet by mouth daily.    [provider]  PROAIR  HFA 108 (90 BASE) MCG/ACT inhaler Inhale 2 puffs into the lungs every 6 (six) hours as needed (WHEEZING).  01/26/14   [provider]  propranolol ER (INDERAL LA) 60 MG 24 hr capsule Take 60 mg by mouth daily. 03/17/14   [provider]  sildenafil (REVATIO) 20 MG tablet SMARTSIG:3-5 Tablet(s) By Mouth Daily 12/19/19   [provider]  tamsulosin (FLOMAX) 0.4 MG CAPS capsule Take 0.4 mg by mouth daily. 10/03/19   [provider]    Allergies: Codeine    Review of Systems  Updated Vital Signs BP (!) 159/74   Pulse 67   Temp 98.3 F (36.8 C)   Resp 20   Ht 5' 7 (1.702 m)   Wt 63.5 kg   SpO2 99%   BMI 21.93 kg/m   Physical Exam Vitals and nursing note reviewed.  Constitutional:      General: He is not in acute distress.    Appearance: He is well-developed.  HENT:     Head: Normocephalic and atraumatic.  Eyes:     Conjunctiva/sclera: Conjunctivae normal.  Pupils: Pupils are equal, round, and reactive to light.  Neck:   Cardiovascular:     Rate and Rhythm: Normal rate and regular rhythm.     Pulses: Normal pulses.     Heart sounds: No murmur heard. Pulmonary:     Effort: Pulmonary effort is normal. No respiratory distress.     Breath sounds: Normal breath sounds. No wheezing or rales.  Abdominal:     General: There is no distension.     Palpations: Abdomen is soft.     Tenderness: There is no abdominal tenderness. There is no guarding or rebound.  Musculoskeletal:        General: Normal range of motion.     Cervical back: Normal range of motion and neck supple. Tenderness present.       Back:  Skin:    General: Skin is warm and dry.     Findings: No erythema or rash.  Neurological:     Mental Status: He is alert and oriented to person,  place, and time. Mental status is at baseline.     Sensory: No sensory deficit.     Motor: No weakness.     Gait: Gait normal.  Psychiatric:        Behavior: Behavior normal.     (all labs ordered are listed, but only abnormal results are displayed) Labs Reviewed - No data to display  EKG: None  Radiology: DG Ribs Unilateral W/Chest Left Result Date: 07/18/2024 EXAM: 1 VIEW(S) XRAY OF THE LEFT RIBS AND CHEST 07/18/2024 07:25:00 PM COMPARISON: None available. CLINICAL HISTORY: Pain after fall. FINDINGS: BONES: Old healed left posterior seventh rib fracture. LUNGS AND PLEURA: No consolidation or pulmonary edema. No pleural effusion or pneumothorax. HEART AND MEDIASTINUM: Coronary artery stent noted. No acute abnormality of the cardiac and mediastinal silhouettes. IMPRESSION: 1. No acute left rib fracture. 2. Old healed left posterior seventh rib fracture. Electronically signed by: Oneil Devonshire MD 07/18/2024 07:31 PM EST RP Workstation: [id number]   CT CERVICAL SPINE WO CONTRAST Result Date: 07/18/2024 EXAM: CT CERVICAL SPINE WITHOUT CONTRAST 07/18/2024 06:52:10 PM TECHNIQUE: CT of the cervical spine was performed without the administration of intravenous contrast. Multiplanar reformatted images are provided for review. Automated exposure control, iterative reconstruction, and/or weight based adjustment of the mA/kV was utilized to reduce the radiation dose to as low as reasonably achievable. COMPARISON: None available. CLINICAL HISTORY: Recent slip and fall with neck pain, initial encounter. FINDINGS: BONES AND ALIGNMENT: 7 cervical segments are well visualized. Vertebral body height is well maintained. No acute fracture or traumatic malalignment. No acute facet abnormality is seen. DEGENERATIVE CHANGES: Bridging osteophytes are noted anteriorly from C5 to T1. Mild facet hypertrophic changes are noted. SOFT TISSUES: No prevertebral soft tissue swelling. Surrounding soft tissue structures are within  normal limits. LUNG APICES: Lung apices are within normal limits. IMPRESSION: 1. No acute fracture or acute facet abnormality. 2. Bridging osteophytes anteriorly from C5 to T1 and mild facet hypertrophic changes. Electronically signed by: Oneil Devonshire MD 07/18/2024 07:03 PM EST RP Workstation: [id number]   CT Head Wo Contrast Result Date: 07/18/2024 EXAM: CT HEAD WITHOUT CONTRAST 07/18/2024 06:52:10 PM TECHNIQUE: CT of the head was performed without the administration of intravenous contrast. Automated exposure control, iterative reconstruction, and/or weight based adjustment of the mA/kV was utilized to reduce the radiation dose to as low as reasonably achievable. COMPARISON: MRI from 04/17/2015. CLINICAL HISTORY: Recent fall with head injury, initial encounter. FINDINGS: BRAIN AND VENTRICLES: No acute hemorrhage. No evidence of  acute infarct. No extra-axial collection. No mass effect or midline shift. Mild atrophic changes are noted. ORBITS: No acute abnormality. SINUSES: No acute abnormality. SOFT TISSUES AND SKULL: No acute soft tissue abnormality. No skull fracture. IMPRESSION: 1. No acute intracranial abnormality. Electronically signed by: Oneil Devonshire MD 07/18/2024 06:59 PM EST RP Workstation: [id number]     Procedures   Medications Ordered in the ED  lidocaine  (LIDODERM ) 5 % 1 patch (1 patch Transdermal Patch Applied 07/18/24 1930)  oxyCODONE  (Oxy IR/ROXICODONE ) immediate release tablet 5 mg (5 mg Oral Given 07/18/24 1929)                                    Medical Decision Making Amount and/or Complexity of Data Reviewed Radiology: ordered and independent interpretation performed. Decision-making details documented in ED Course.  Risk Prescription drug management.   Pt with multiple medical problems and comorbidities and presenting today with a complaint that caries a high risk for morbidity and mortality.  Here today after a trip and fall on the ice.  Patient did have injury to his  head and possible LOC.  Here he is awake and alert with a normal neurologic exam.  Wife did report he had some required of questioning earlier.  He does take Plavix  and will rule out intracranial hemorrhage however also concern that patient may have a concussion.  Also having pain in the left posterior rib area.  No shortness of breath and comfortable at rest on exam.  Was given pain control. I have independently visualized and interpreted pt's images today.  Head CT was negative for bleed, cervical spine without fracture.  Radiology reported no acute intracranial findings and cervical spine was negative but he does have bridging osteophytes from C5-T1.  Also chest x-ray without evidence of rib fractures.  All this was discussed with the patient.  At this time he appears stable for discharge home.  He was given return precautions.       Final diagnoses:  Fall, initial encounter  Contusion of scalp, initial encounter  Concussion with unknown loss of consciousness status, initial encounter  Rib contusion, left, initial encounter    ED Discharge Orders     None          Doretha Folks, MD 07/18/24 1945  "

## 2024-07-18 NOTE — Discharge Instructions (Signed)
 You can take extra strength Tylenol 1 tablet before you go to bed and then you can take 2 tablets every 6 hours as needed for pain.  You can also get the lidocaine  patches over-the-counter.  If you start having severe headache, vomiting, confusion, difficulty walking you should return to the emergency room immediately.  Thankfully all your x-rays today were normal.

## 2024-08-24 ENCOUNTER — Ambulatory Visit: Admitting: Student in an Organized Health Care Education/Training Program
